# Patient Record
Sex: Male | Born: 1935 | Race: White | Hispanic: No | Marital: Married | Smoking: Former smoker
Health system: Southern US, Community
[De-identification: ages and names within clinical notes are randomized; demographics above are authoritative.]

## PROBLEM LIST (undated history)

## (undated) DIAGNOSIS — F329 Major depressive disorder, single episode, unspecified: Secondary | ICD-10-CM

## (undated) DIAGNOSIS — M1990 Unspecified osteoarthritis, unspecified site: Secondary | ICD-10-CM

## (undated) DIAGNOSIS — E785 Hyperlipidemia, unspecified: Secondary | ICD-10-CM

## (undated) DIAGNOSIS — E039 Hypothyroidism, unspecified: Secondary | ICD-10-CM

## (undated) DIAGNOSIS — I639 Cerebral infarction, unspecified: Secondary | ICD-10-CM

## (undated) DIAGNOSIS — Z8673 Personal history of transient ischemic attack (TIA), and cerebral infarction without residual deficits: Secondary | ICD-10-CM

## (undated) DIAGNOSIS — I2510 Atherosclerotic heart disease of native coronary artery without angina pectoris: Secondary | ICD-10-CM

## (undated) DIAGNOSIS — I10 Essential (primary) hypertension: Secondary | ICD-10-CM

## (undated) DIAGNOSIS — I219 Acute myocardial infarction, unspecified: Secondary | ICD-10-CM

## (undated) DIAGNOSIS — F32A Depression, unspecified: Secondary | ICD-10-CM

## (undated) DIAGNOSIS — K219 Gastro-esophageal reflux disease without esophagitis: Secondary | ICD-10-CM

## (undated) DIAGNOSIS — N184 Chronic kidney disease, stage 4 (severe): Secondary | ICD-10-CM

## (undated) DIAGNOSIS — I119 Hypertensive heart disease without heart failure: Secondary | ICD-10-CM

## (undated) DIAGNOSIS — Z9289 Personal history of other medical treatment: Secondary | ICD-10-CM

## (undated) DIAGNOSIS — R0602 Shortness of breath: Secondary | ICD-10-CM

## (undated) DIAGNOSIS — M519 Unspecified thoracic, thoracolumbar and lumbosacral intervertebral disc disorder: Secondary | ICD-10-CM

## (undated) HISTORY — PX: TRANSURETHRAL RESECTION OF PROSTATE: SHX73

## (undated) HISTORY — PX: CARDIAC CATHETERIZATION: SHX172

## (undated) HISTORY — PX: CORONARY STENT PLACEMENT: SHX1402

## (undated) HISTORY — PX: CORONARY ARTERY BYPASS GRAFT: SHX141

## (undated) HISTORY — PX: APPENDECTOMY: SHX54

## (undated) HISTORY — PX: TONSILLECTOMY: SUR1361

## (undated) HISTORY — PX: HERNIA REPAIR: SHX51

---

## 1999-01-05 ENCOUNTER — Inpatient Hospital Stay (HOSPITAL_COMMUNITY): Admission: EM | Admit: 1999-01-05 | Discharge: 1999-01-08 | Payer: Self-pay | Admitting: Emergency Medicine

## 1999-01-05 ENCOUNTER — Encounter: Payer: Self-pay | Admitting: Emergency Medicine

## 1999-01-20 ENCOUNTER — Encounter (HOSPITAL_COMMUNITY): Admission: RE | Admit: 1999-01-20 | Discharge: 1999-04-20 | Payer: Self-pay | Admitting: Cardiology

## 1999-06-09 ENCOUNTER — Encounter: Payer: Self-pay | Admitting: Cardiology

## 1999-06-09 ENCOUNTER — Inpatient Hospital Stay (HOSPITAL_COMMUNITY): Admission: EM | Admit: 1999-06-09 | Discharge: 1999-06-10 | Payer: Self-pay | Admitting: Emergency Medicine

## 2002-02-22 ENCOUNTER — Ambulatory Visit (HOSPITAL_COMMUNITY): Admission: RE | Admit: 2002-02-22 | Discharge: 2002-02-22 | Payer: Self-pay | Admitting: Cardiology

## 2002-02-27 ENCOUNTER — Ambulatory Visit (HOSPITAL_COMMUNITY): Admission: RE | Admit: 2002-02-27 | Discharge: 2002-02-28 | Payer: Self-pay | Admitting: Cardiology

## 2002-03-19 ENCOUNTER — Encounter (HOSPITAL_COMMUNITY): Admission: RE | Admit: 2002-03-19 | Discharge: 2002-06-17 | Payer: Self-pay | Admitting: Cardiology

## 2002-06-18 ENCOUNTER — Encounter: Admission: RE | Admit: 2002-06-18 | Discharge: 2002-09-16 | Payer: Self-pay | Admitting: Cardiology

## 2003-05-11 DIAGNOSIS — I219 Acute myocardial infarction, unspecified: Secondary | ICD-10-CM

## 2003-05-11 HISTORY — PX: CORONARY ARTERY BYPASS GRAFT: SHX141

## 2003-05-11 HISTORY — DX: Acute myocardial infarction, unspecified: I21.9

## 2004-01-29 ENCOUNTER — Encounter: Admission: RE | Admit: 2004-01-29 | Discharge: 2004-01-29 | Payer: Self-pay | Admitting: Neurological Surgery

## 2004-03-05 ENCOUNTER — Ambulatory Visit (HOSPITAL_COMMUNITY): Admission: RE | Admit: 2004-03-05 | Discharge: 2004-03-05 | Payer: Self-pay | Admitting: Cardiology

## 2004-03-12 ENCOUNTER — Inpatient Hospital Stay (HOSPITAL_COMMUNITY): Admission: EM | Admit: 2004-03-12 | Discharge: 2004-03-26 | Payer: Self-pay | Admitting: Emergency Medicine

## 2004-04-09 ENCOUNTER — Encounter: Admission: RE | Admit: 2004-04-09 | Discharge: 2004-04-09 | Payer: Self-pay | Admitting: Cardiothoracic Surgery

## 2004-04-20 ENCOUNTER — Encounter (HOSPITAL_COMMUNITY): Admission: RE | Admit: 2004-04-20 | Discharge: 2004-07-19 | Payer: Self-pay | Admitting: Cardiology

## 2004-05-07 ENCOUNTER — Encounter: Admission: RE | Admit: 2004-05-07 | Discharge: 2004-05-07 | Payer: Self-pay | Admitting: Cardiothoracic Surgery

## 2005-04-05 ENCOUNTER — Ambulatory Visit: Payer: Self-pay | Admitting: Physical Medicine & Rehabilitation

## 2005-04-05 ENCOUNTER — Inpatient Hospital Stay (HOSPITAL_COMMUNITY): Admission: RE | Admit: 2005-04-05 | Discharge: 2005-04-10 | Payer: Self-pay | Admitting: Orthopedic Surgery

## 2005-04-09 HISTORY — PX: JOINT REPLACEMENT: SHX530

## 2005-04-18 IMAGING — CR DG CHEST 2V
2 series · 2 of 2 positions shown · non-contrast
Comparison: none

CLINICAL DATA: Three weeks post CABG/CAD.  Back pain. 
 CHEST, TWO VIEWS: 
 Since 03/25/04 development of approximately 3.5cm long x 2.5cm wide opacity is seen at the periphery of the left mid lung field on PA view.  This could represent pleural opacity or area of slight focal atelectatic or inflammation.  Interval clearing is seen at the left lung base with marked decrease in small right pleural effusion.  The lungs are otherwise clear.  Heart size remains normal with post CABG change.  Removal of right internal jugular central venous catheter is noted with stable right hilar benign calcified lymph nodes.

[view not recorded (1 of 2)]
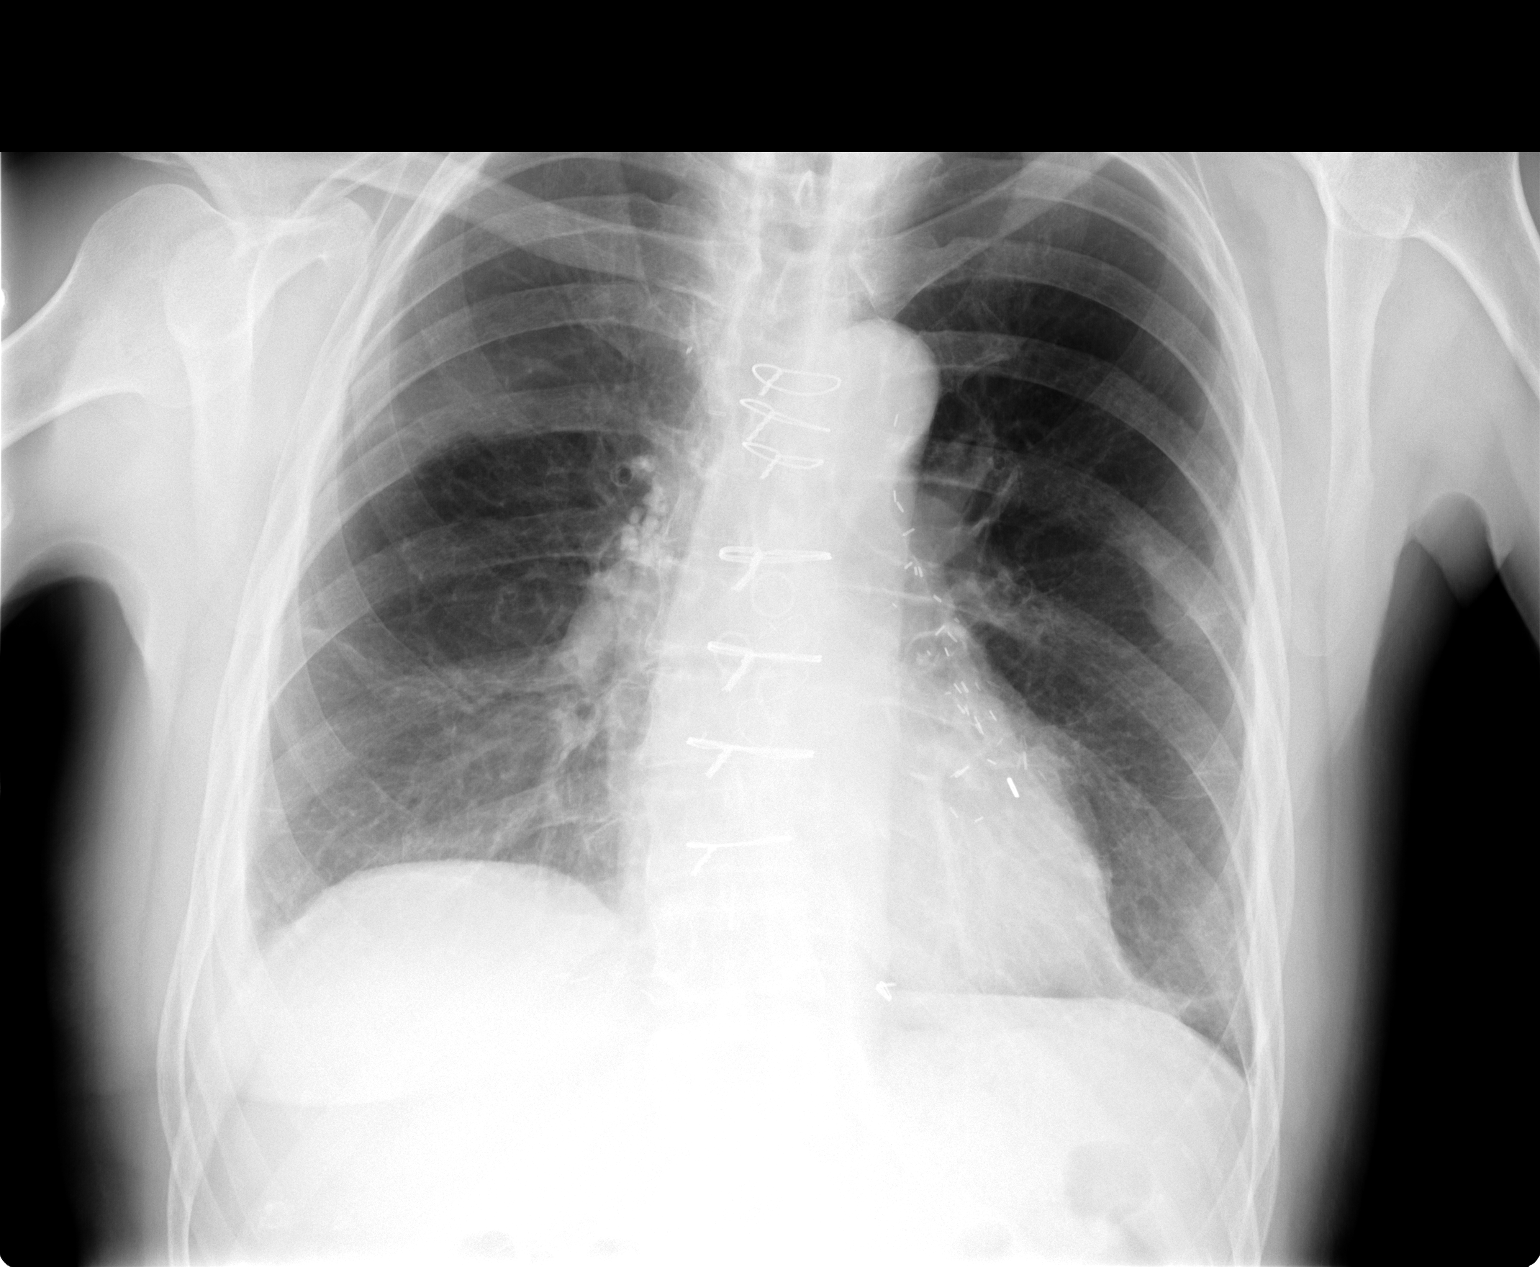

[view not recorded (2 of 2)]
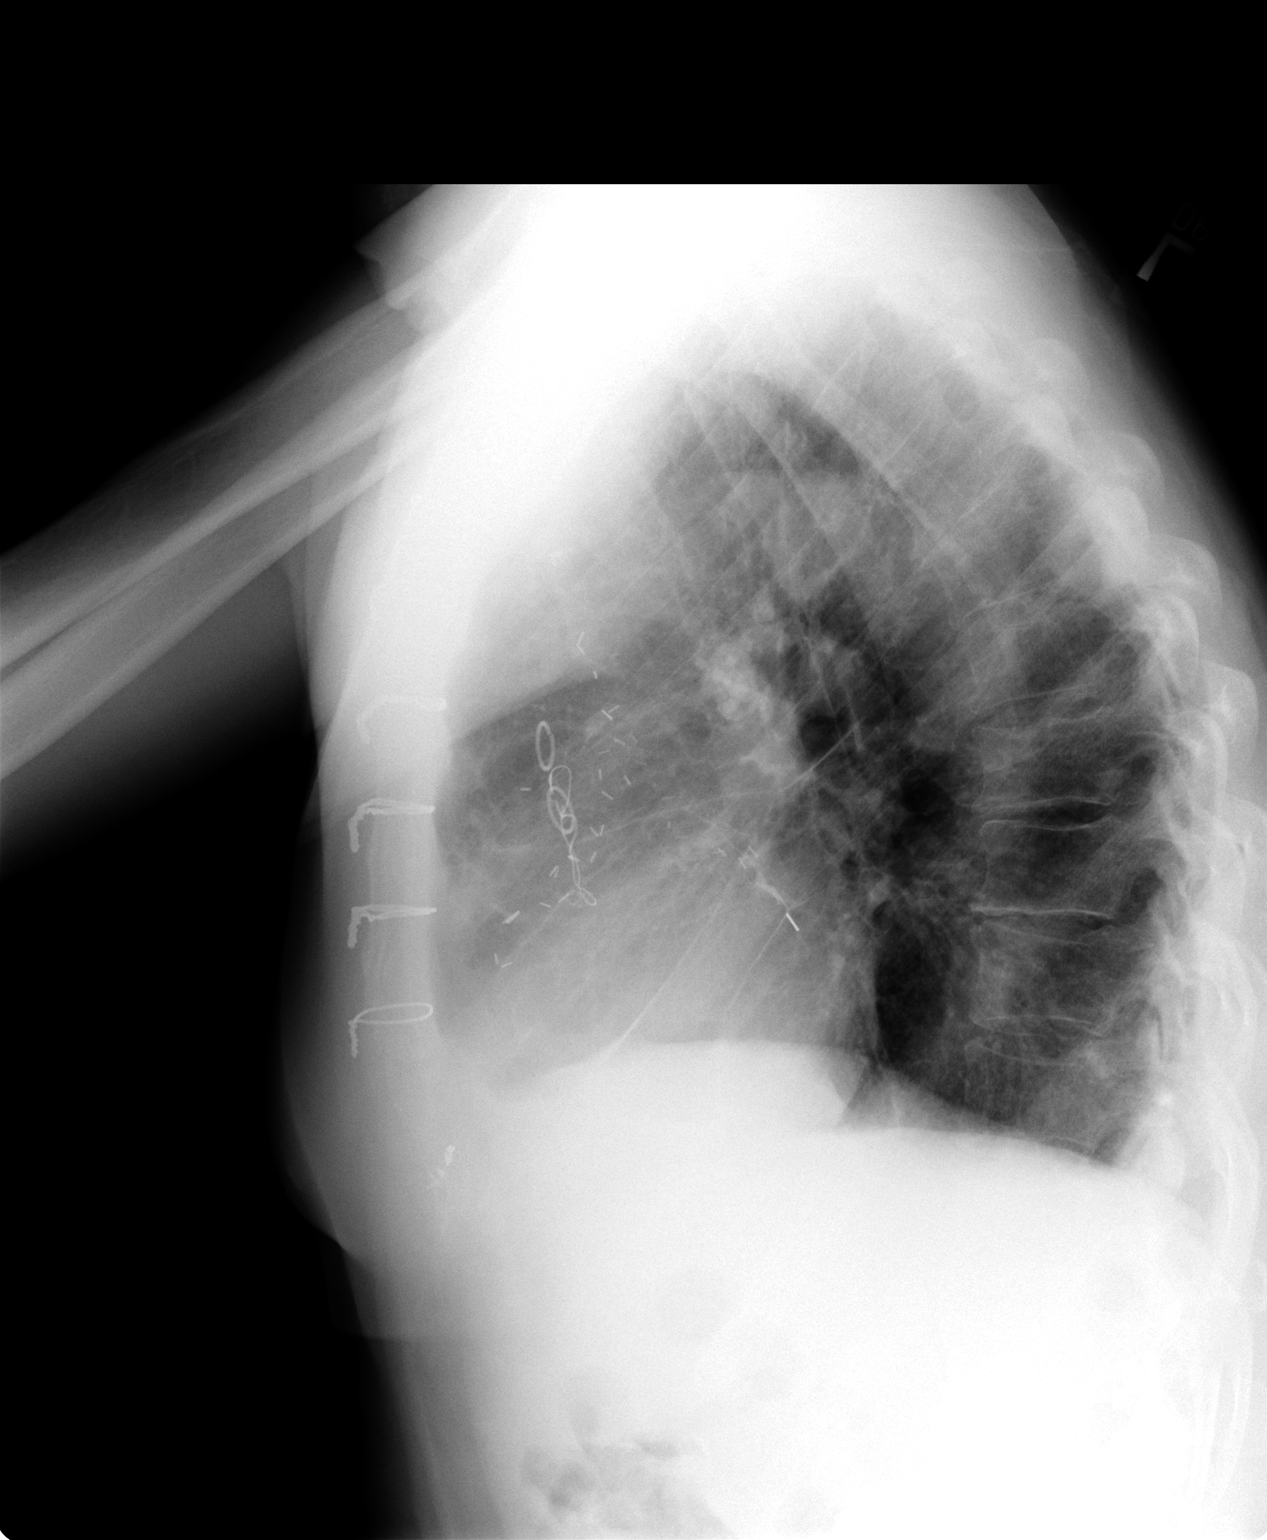

[2 of 2 positions shown; findings below may reference images not displayed]

IMPRESSION: Since 03/24/04: 
 [DATE].  Development of focal opacity at the periphery left mid lung as described with regression of previous ovoid opacity left lung base and decrease in small right pleural effusion. 
 2.  Otherwise no active disease.

## 2005-05-16 IMAGING — CR DG CHEST 2V
2 series · 2 of 2 positions shown · non-contrast
Comparison: none

CLINICAL DATA: CAD.  Post CABG.  Follow-up of left mid lung opacity on chest x-ray of 04/09/04.
 DIAGNOSTIC CHEST ? TWO VIEWS:

[view not recorded (1 of 2)]
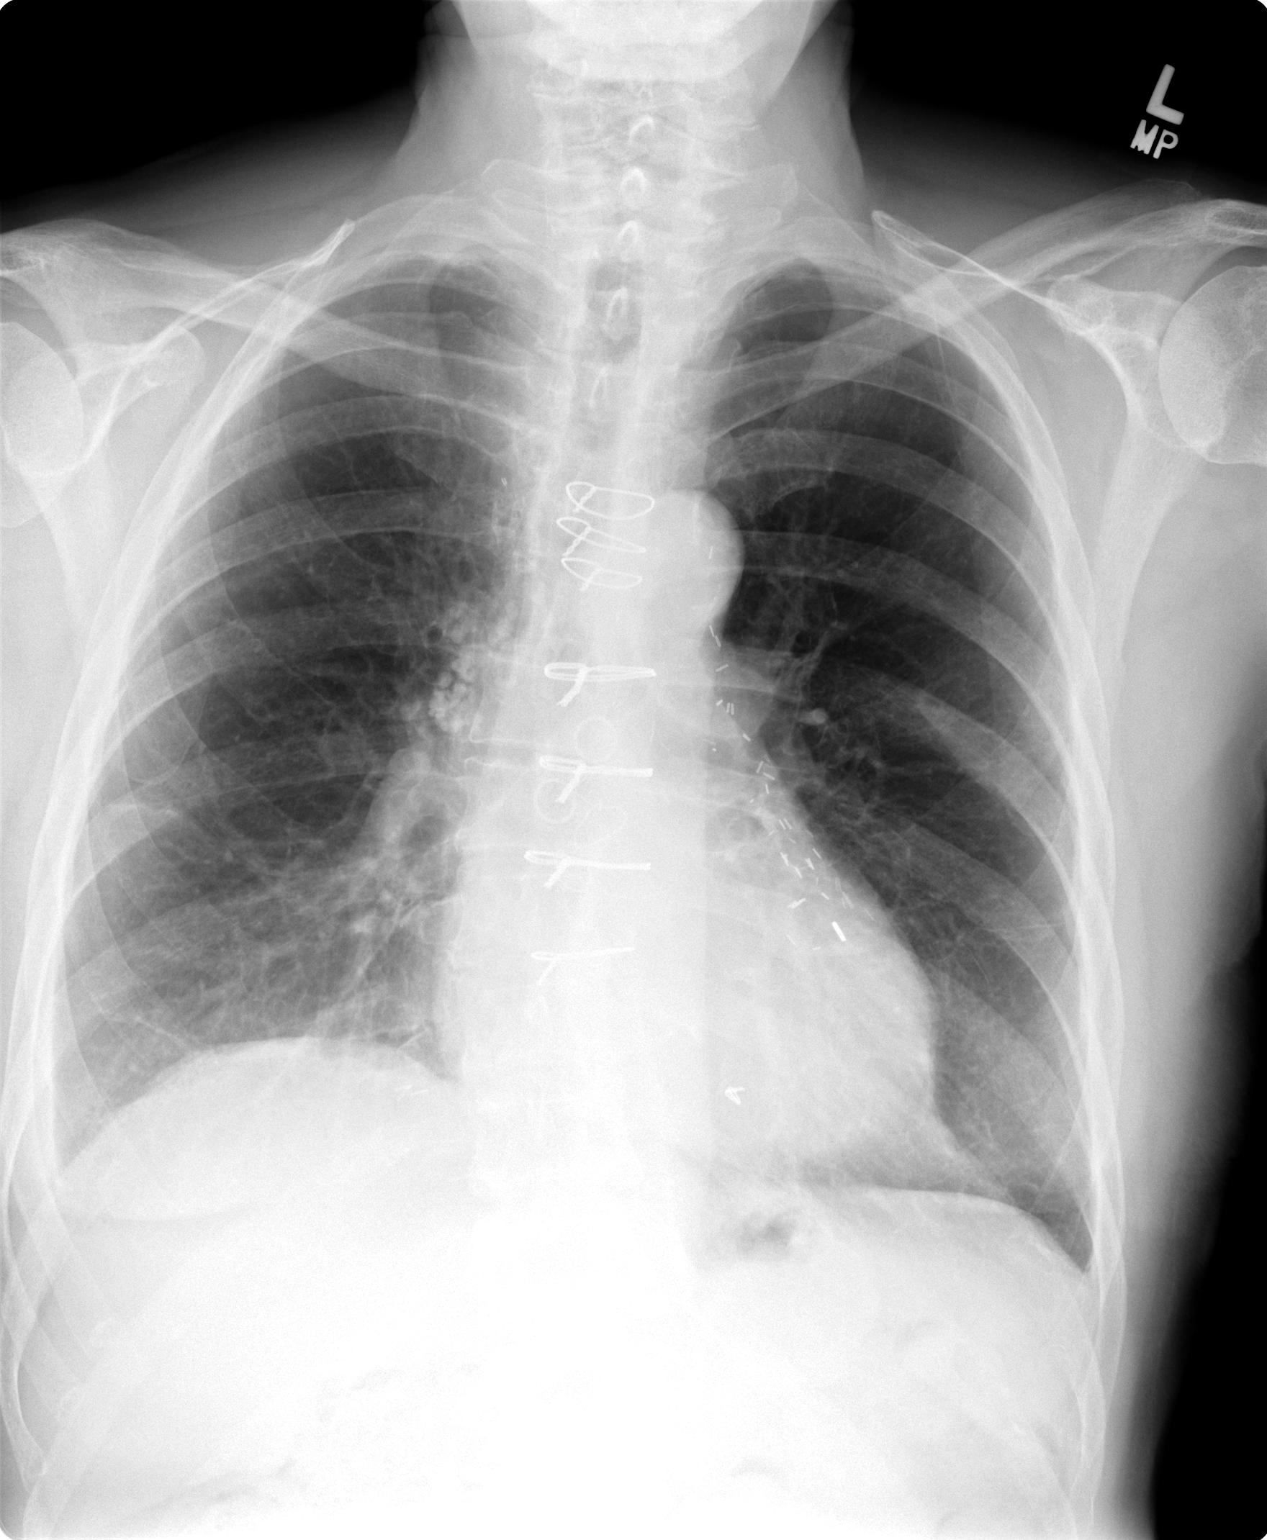

[view not recorded (2 of 2)]
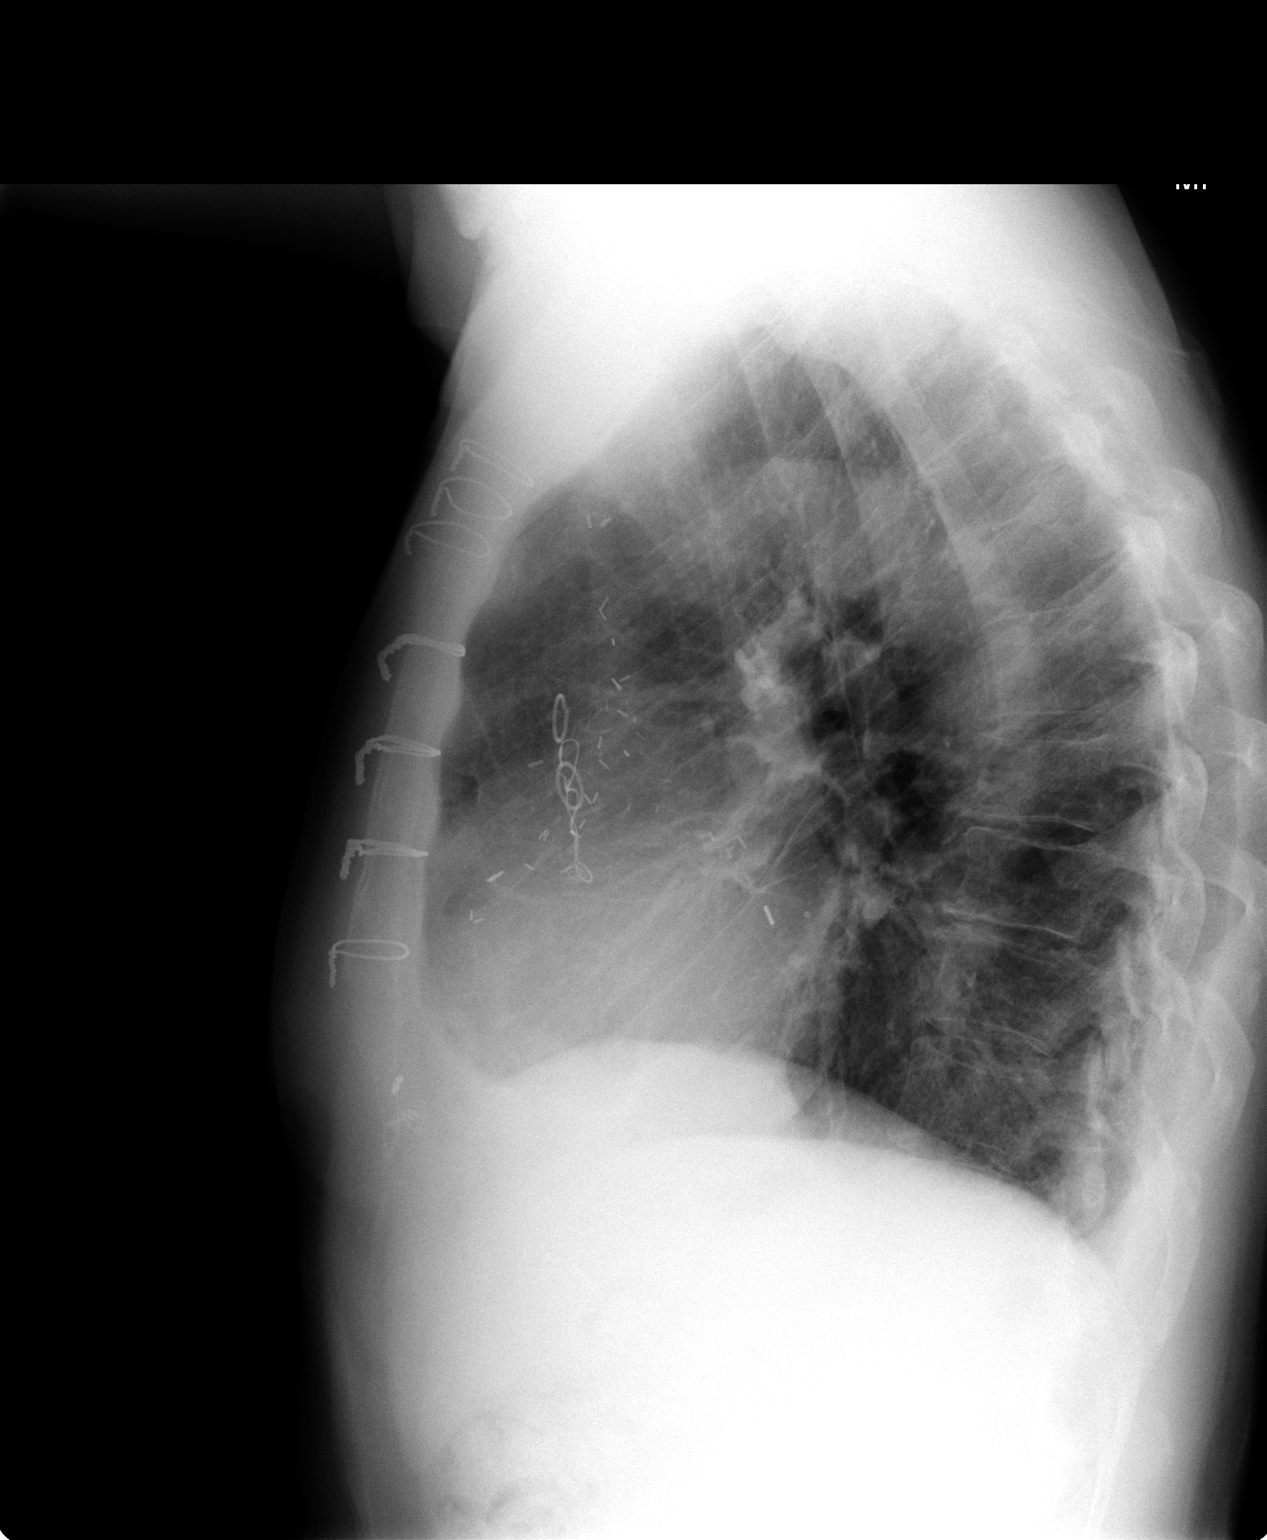

[2 of 2 positions shown; findings below may reference images not displayed]

FINDINGS: Comparison is made with GI-[REDACTED] chest x-ray, 04/09/04.  Since the prior study, there is resolution of previous 3.5 cm long x 2.5 cm wide opacity at the periphery of the left mid lung field.  Stable slight chronic interstitial lung changes are seen at the right lower lobe and lesser lower lobe with the lungs otherwise clear.  Heart size remains normal with post CABG changes.  Stable benign appearing right hilar calcified lymph nodes are noted.   The mediastinum, hila, pleura and osseous structures are stable.
IMPRESSION: Since [REDACTED] chest x-ray 04/09/04:
 1.  Interval clearing of left mid lung focal opacity.
 2.  Stable old granulomatous disease and right greater than left lower lobe slight chronic interstitial lung changes. 
 3.  Otherwise no active disease.

## 2006-03-21 ENCOUNTER — Encounter: Admission: RE | Admit: 2006-03-21 | Discharge: 2006-03-21 | Payer: Self-pay | Admitting: Orthopedic Surgery

## 2006-06-03 ENCOUNTER — Encounter: Admission: RE | Admit: 2006-06-03 | Discharge: 2006-06-03 | Payer: Self-pay | Admitting: Orthopaedic Surgery

## 2006-06-21 ENCOUNTER — Encounter: Admission: RE | Admit: 2006-06-21 | Discharge: 2006-06-21 | Payer: Self-pay | Admitting: Orthopaedic Surgery

## 2007-01-23 ENCOUNTER — Encounter: Admission: RE | Admit: 2007-01-23 | Discharge: 2007-01-23 | Payer: Self-pay | Admitting: Cardiology

## 2008-08-08 HISTORY — PX: KIDNEY CYST REMOVAL: SHX684

## 2008-08-21 ENCOUNTER — Encounter: Admission: RE | Admit: 2008-08-21 | Discharge: 2008-08-21 | Payer: Self-pay | Admitting: Urology

## 2009-02-22 ENCOUNTER — Observation Stay (HOSPITAL_COMMUNITY): Admission: EM | Admit: 2009-02-22 | Discharge: 2009-02-23 | Payer: Self-pay | Admitting: Emergency Medicine

## 2010-05-10 HISTORY — PX: OTHER SURGICAL HISTORY: SHX169

## 2010-08-13 LAB — COMPREHENSIVE METABOLIC PANEL
ALT: 13 U/L (ref 0–53)
AST: 22 U/L (ref 0–37)
Albumin: 4 g/dL (ref 3.5–5.2)
Alkaline Phosphatase: 80 U/L (ref 39–117)
BUN: 42 mg/dL — ABNORMAL HIGH (ref 6–23)
CO2: 23 mEq/L (ref 19–32)
Calcium: 9.3 mg/dL (ref 8.4–10.5)
Chloride: 109 mEq/L (ref 96–112)
Creatinine, Ser: 2.53 mg/dL — ABNORMAL HIGH (ref 0.4–1.5)
GFR calc Af Amer: 29 mL/min — ABNORMAL LOW (ref >60)
GFR calc non Af Amer: 24 mL/min — ABNORMAL LOW (ref >60)
Glucose, Bld: 122 mg/dL — ABNORMAL HIGH (ref 70–99)
Potassium: 3.9 mEq/L (ref 3.5–5.1)
Sodium: 141 mEq/L (ref 135–145)
Total Bilirubin: 1.1 mg/dL (ref 0.3–1.2)
Total Protein: 8 g/dL (ref 6.0–8.3)

## 2010-08-13 LAB — MAGNESIUM: Magnesium: 2.2 mg/dL (ref 1.5–2.5)

## 2010-08-13 LAB — DIFFERENTIAL
Basophils Absolute: 0 10*3/uL (ref 0.0–0.1)
Basophils Relative: 0 % (ref 0–1)
Eosinophils Absolute: 0 10*3/uL (ref 0.0–0.7)
Eosinophils Relative: 0 % (ref 0–5)
Lymphocytes Relative: 5 % — ABNORMAL LOW (ref 12–46)
Lymphs Abs: 0.8 10*3/uL (ref 0.7–4.0)
Monocytes Absolute: 0.6 10*3/uL (ref 0.1–1.0)
Monocytes Relative: 4 % (ref 3–12)
Neutro Abs: 14.5 10*3/uL — ABNORMAL HIGH (ref 1.7–7.7)
Neutrophils Relative %: 91 % — ABNORMAL HIGH (ref 43–77)

## 2010-08-13 LAB — URINE MICROSCOPIC-ADD ON

## 2010-08-13 LAB — CBC
HCT: 39.3 % (ref 39.0–52.0)
Hemoglobin: 13.1 g/dL (ref 13.0–17.0)
MCHC: 33.4 g/dL (ref 30.0–36.0)
MCV: 96.4 fL (ref 78.0–100.0)
Platelets: 287 10*3/uL (ref 150–400)
RBC: 4.08 MIL/uL — ABNORMAL LOW (ref 4.22–5.81)
RDW: 13.5 % (ref 11.5–15.5)
WBC: 16 10*3/uL — ABNORMAL HIGH (ref 4.0–10.5)

## 2010-08-13 LAB — URINALYSIS, ROUTINE W REFLEX MICROSCOPIC
Bilirubin Urine: NEGATIVE
Glucose, UA: NEGATIVE mg/dL
Ketones, ur: NEGATIVE mg/dL
Nitrite: NEGATIVE
Protein, ur: 30 mg/dL — AB
Specific Gravity, Urine: 1.022 (ref 1.005–1.030)
Urobilinogen, UA: 0.2 mg/dL (ref 0.0–1.0)
pH: 5 (ref 5.0–8.0)

## 2010-08-13 LAB — LIPASE, BLOOD: Lipase: 26 U/L (ref 11–59)

## 2010-08-13 LAB — TSH: TSH: 5.804 u[IU]/mL — ABNORMAL HIGH (ref 0.350–4.500)

## 2010-08-13 LAB — BASIC METABOLIC PANEL
BUN: 32 mg/dL — ABNORMAL HIGH (ref 6–23)
Calcium: 8 mg/dL — ABNORMAL LOW (ref 8.4–10.5)
GFR calc non Af Amer: 28 mL/min — ABNORMAL LOW (ref >60)
Glucose, Bld: 121 mg/dL — ABNORMAL HIGH (ref 70–99)
Sodium: 139 mEq/L (ref 135–145)

## 2010-08-13 LAB — PHOSPHORUS: Phosphorus: 2.6 mg/dL (ref 2.3–4.6)
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
## 2010-09-25 NOTE — Cardiovascular Report (Signed)
NAME:  [name], [name] NO.:  [id number]   MEDICAL RECORD NO.:  [id number]                   PATIENT TYPE:  OIB   LOCATION:  2854                                 FACILITY:  [facility]   PHYSICIAN:  W. Ashley Royalty., M.D.         DATE OF BIRTH:  1920-05-24   DATE OF PROCEDURE:  02/22/2002  DATE OF DISCHARGE:                              CARDIAC CATHETERIZATION   HISTORY:  A 77 year old male who has a previous history of coronary bypass  grafting in 1993 who presented with increasing chest discomfort that was  partially relieved with Plavix as well as Norvasc.  New inferior T wave  inversions.   COMMENTS ABOUT PROCEDURE:  The patient tolerated the procedure well without  complications.  The internal mammary graft was selected with a mammary  catheter.  The remaining vessels were selected with a right coronary  catheter.  He tolerated the procedure well.   HEMODYNAMIC DATA:  1. Aorta post contrast 120/70.  2. LV post contrast 120/0 to 10-15.   ANGIOGRAPHIC DATA:  1. Left ventriculogram:  Performed in the 30-degree RAO projection.  The     aortic valve appears normal.  There is mild mitral regurgitation noted     which may in part be catheter- or PVC-induced.  There is inferior wall     hypokinesis noted which is moderate to severe.  The remaining segments     contract normally.  The estimated ejection fraction is 50-55%.  2. Coronary arteries arise and distribute normally.  Heavily calcified left     main and proximal LAD.  Mild calcification in proximal right coronary     artery.  3. The left main coronary artery is calcified with mild distal narrowing.  4. Left anterior descending artery:  A 70% stenosis prior to septal     perforator and the native LAD.  The first diagonal branch is small with     an ostial stenosis.  The second diagonal branch is widely patent.  The     distal vessel is occluded and fills by a patent graft.  5. Circumflex  coronary artery:  Calcified and is diffusely diseased in its     proximal portion and mid portion.  Both obtuse marginal arteries are     occluded and fill by collaterals from the left coronary system.     Collateral filling is seen going to the distal right coronary artery.  6. Right coronary artery:  Calcified proximally.  Severe 95% proximal     stenosis which is segmental.  Diffusely diseased prior to a continuation     branch and is then totally occluded.  7. Saphenous vein graft to OM-1 and OM-1:  There is a mild 30-40%     anastomotic narrowing.  There is a mid vessel valve but the graft is     widely patent with two patent distal anastomotic sites.  8. Saphenous vein  graft to the second diagonal:  Occluded.  9. Saphenous vein graft to the posterior descending and posterolateral     branch:  Proximal anastomotic site is mildly narrowed.  There has been a     severe eccentric proximal stenosis estimated at 99% in the shaft of the     vein graft proximally.  The two distal anastomotic sites are widely     patent.  There is stenosis in the posterior descending artery after the     insertion site of the vein graft to the posterior descending portion     estimated at 90% in the distal vessel.  10.      Internal mammary artery graft to the LAD and diagonal:  Widely     patent.  The distal LAD and diagonal are very small vessels but appear to     be small and somewhat atretic.   IMPRESSION:  1. Normal left ventricular function with inferior wall hypokinesis but     preserved ejection fraction.  2. Severe native three-vessel coronary artery disease.  3. Saphenous vein graft disease with severe vein graft stenosis and proximal     right coronary artery graft.  Occlusion of vein graft to diagonal.     Patent vein grafts to obtuse marginals #1 and #2.  Patent internal     mammary grafts to left anterior descending artery and diagonal.   RECOMMENDATIONS:  Hydrate and allow to recover from  contrast.  Consider  stenting of the vein graft to the posterior descending and posterolateral  branch.                                                Darden Palmer., M.D.    WST/MEDQ  D:  02/22/2002  T:  02/22/2002  Job:  [id number]   cc:   Gloriajean Dell. Andrey Campanile, M.D.   Cardiac Catheterization Lab

## 2010-09-25 NOTE — Cardiovascular Report (Signed)
NAMEABDULLAHI, [name]                ACCOUNT NO.:  [id number]   MEDICAL RECORD NO.:  [id number]          PATIENT TYPE:  AMB   LOCATION:  ENDO                         FACILITY:  [facility]   PHYSICIAN:  W. Ashley Royalty., M.D.DATE OF BIRTH:  18-May-1920   DATE OF PROCEDURE:  03/05/2004  DATE OF DISCHARGE:                              CARDIAC CATHETERIZATION   PROCEDURE:  Cardiac catheterization.   INDICATIONS:  Chest pain, previous coronary bypass grafting, and abnormal  Cardiolite scan with inferior iscehmia and a new inferior infarction.   COMMENTS ABOUT PROCEDURE:  The patient tolerated the procedure well without  difficulty.  The internal mammary graft was selected using an IMA catheter.  It was difficult to selectively inject the IMA, but good visualization was  noted.  Following the procedure, he had good hemostasis and good peripheral  pulses noted.   HEMODYNAMICS:  Aorta post contrast -- 170/83.  LV post contrast -- 170/13-  20.   ANGIOGRAPHIC DATA:   LEFT VENTRICULOGRAM:  Performed in the 30-degree RAO projection.  The aortic  valve appears normal.  Mitral valve appears normal.  The left ventricle has  an area of inferior basal hypokinesis noted with slight reduction in  ejection fraction estimated at 45% to 50%.   Coronary arteries:  Arise and distribute normally.  Calcification noted in  the left main and proximal LAD, with a calcification noted in proximal right  coronary artery.   Left main coronary artery calcified with mild distal narrowing.   Left anterior descending:  Ninety percent proximal stenosis prior to the  septal perforator and the native LAD.  First diagonal branch is very small  and atretic.  There appears to be antegrade filling of the native left  anterior descending, which is a very small vessel, as well as filling by  means of the distal bypass graft.   Circumflex coronary artery:  Patent continuation branch, but occluded  marginal branch, as  collateral filling is seen extensively to the distal  right coronary artery.  There are several branches involved in the right  coronary artery which fill through collaterals.   Saphenous vein graft to the OM-1 and OM-2:  There is a 90% segmental  anastomotic narrowing.  There is a mid-vessel valve, but the graft is patent  with 2 patent distal anastomotic sites and moderate-sized distal vessels.  The saphenous vein graft to the second diagonal branch is occluded at  saphenous vein graft to the posterior descending and posterolateral branch.  The previous stented site is noted.  The graft appears to be occluded and is  not seen with selective injections, nor does it fill on the ventriculogram.  The internal mammary graft to the LAD and diagonal is widely patent.  The  distal LAD and diagonal are very small vessels, and are atretic, but appear  to be patent and filled by the graft as well as some antegrade flow.   IMPRESSION:  1.  Slightly reduced left ventricular function with inferior wall      hypokinesis.  2.  Severe native three-vessel coronary artery disease.  3.  Severe saphenous vein graft disease with severe vein graft stenosis      involving the obtuse marginal branch grafts, occlusion of the vein graft      to the right coronary artery, and occlusion of the vein graft to the      diagonal, patent internal mammary grafts to a small left anterior      descending and diagonal.   RECOMMENDATIONS:  Hydrate and allow to recover from contrast.  He has now  occluded the previously stented site of the vein graft to the right coronary  artery.  We will consult surgeons for possibility of redo coronary bypass  grafting.      Kristine Royal   WST/MEDQ  D:  03/05/2004  T:  03/05/2004  Job:  [id number]   cc:   Gloriajean Dell. Andrey Campanile, M.D.  [address]  [place]  [place] [address]  Fax: [phone]

## 2010-09-25 NOTE — H&P (Signed)
[name], NO NO.:  [id number]   MEDICAL RECORD NO.:  [id number]                    PATIENT TYPE:   LOCATION:                                       FACILITY:   PHYSICIAN:  W. Ashley Royalty., M.D.         DATE OF BIRTH:  [birth year]   DATE OF ADMISSION:  02/22/2002  DATE OF DISCHARGE:                                HISTORY & PHYSICAL   HISTORY OF PRESENT ILLNESS:  A 77 year old male who was brought in for  elective cardiac catheterization.  He has a prior history of coronary artery  bypass grafting August 24, 1991.  He had a small subendocardial infarction  and had catheterization on January 06, 1999.  At that time, he had  anteroapical and distal inferior hypokinesis with an EF of 40%-45%.  LAD had  an eccentric 80% stenosis prior to the septal perforator and diagonal  branch, distal vessel filled by collaterals.  Sequential vein graft to the  OM1 and 2 had moderate stenosis.  Saphenous vein graft to first diagonal  branch was diffusely diseased.  A vein graft to the PD and PL had a moderate  ostial 40% stenosis.  Mammary graft to the diagonal and LAD was patent.  The  distal LAD and diagonal grafts were somewhat atretic.  He has been treated  medically since that time.  He presented to the office after almost a one-  year absence with increasing malaise, fatigue, and chest discomfort.  He had  rarely taken nitroglycerin but noted significant pressure and tightness  which would radiate up into his neck.  These symptoms would be worse  occasionally with exertion and did begin a couple months ago and would occur  at night.  He has not had any congestive heart failure, claudication, or  TIA.  He stopped taking Pravachol but also noted that he continued to have  some difficulty with his memory and his concentration.  Catheterization was  advised because of new inferior T-wave changes.   PAST MEDICAL HISTORY:  Remarkable for hypertension, mild  renal  insufficiency.  No prior history of diabetes or ulcers.   PAST SURGICAL HISTORY:  Appendectomy, knee surgery, herniorrhaphy, and  coronary artery bypass grafting.   ALLERGIES:  1. TICLID.  2. CODEINE.  3. VOLTAREN.   CURRENT MEDICATIONS:  1. Aspirin daily.  2. Toprol XL 50 daily.  3. Xanax 0.25 daily.  4. He also has had previous Plavix 75 daily and Norvasc that were evaluated     the other day.   FAMILY HISTORY:  Father died in his 16s of kidney failure and heart failure.  Mother died at age 35 of CHF.  His sister is in good health.   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  He has been an Higher education careers adviser for several years.  His  wife is also an  Art gallery manager.  He has two children living.  A daughter died in  62 at age 55.  He does not smoke or use alcohol to excess.   REVIEW OF SYSTEMS:  He has an obsessive personality.  He has seen a  psychiatrist briefly in the past.  He has BPH previously.  He has been on an  experimental protocol through [facility] for memory loss.  He has had  significant hypertension.  He had a transient ischemic attack and has been  under treatment by the neurologists previously for this.  He had a possible  stroke versus TIA.  He has also had previous hernia surgery.  He has  seasonal perennial allergies and arthritis.  Other than as noted above, the  remainder of the review of systems is unremarkable.   PHYSICAL EXAMINATION:  GENERAL:  He is an elderly male who is a somewhat  rambling historian.  VITAL SIGNS:  Weight 173 pounds, blood pressure 114/70 sitting, 110/70  standing, pulse is 72.  SKIN:  Warm and dry.  HEENT:  No JVD, thyromegaly, or bruits.  Funduscopic unremarkable.  LUNGS:  Clear to A&P.  CARDIAC:  Normal S1 and S2.  No S3.  ABDOMEN:  Soft and nontender.  No aneurysm.  Pulses are 2+.  EXTREMITIES:  Peripheral pulses are diminished and are 1+.  There is no  edema.   NEUROLOGIC:  Normal.  GENITOURINARY/RECTAL:  Deferred.   IMPRESSION:  1. Chest discomfort with new T-wave changes noted inferiorly.  Rule out     progressive coronary graft disease.  2. Coronary artery bypass grafting for severe coronary disease in 1993 with     vein graft disease noted in 2000.  3. Hypertension.  4. Anxiety.   RECOMMENDATIONS:  Brought in at this time for cardiac catheterization.  The  procedure was discussed with the patient fully including the risks and he is  agreeable and willing to proceed.                                                Darden Palmer., M.D.    WST/MEDQ  D:  02/21/2002  T:  02/21/2002  Job:  [id number]   cc:   Gloriajean Dell. Andrey Campanile, M.D.

## 2010-09-25 NOTE — Consult Note (Signed)
[name], MARSCHALL NO.:  [id number]   MEDICAL RECORD NO.:  [id number]          PATIENT TYPE:  INP   LOCATION:  2920                         FACILITY:  [facility]   PHYSICIAN:  Sheliah Plane, MD    DATE OF BIRTH:  1921/03/15   DATE OF CONSULTATION:  03/13/2004  DATE OF DISCHARGE:                                   CONSULTATION   CARDIAC SURGERY CONSULTATION:   REFERRING PHYSICIAN:  Georga Hacking, M.D.   REASON FOR CONSULTATION:  Recurrent coronary occlusive disease   HISTORY OF PRESENT ILLNESS:  The patient is an 77 year old male, who has  known coronary occlusive disease, having had bypass surgery times seven in  1993.  About two years ago, a drug eluting stent was placed in the vein  graft to the right coronary artery.  Over the past several months, he had  progressive fatigue and substernal chest pain while playing golf.  Cardiolite stress test showed inferior wall ischemia and an ejection  fraction of 45%.  Cardiac catheterization was done last week which showed  total occlusion of the right vein graft, occlusion of the vein graft to a  first diagonal patent mammary to the second diagonal and LAD and a patent,  but diseased vein graft to 2 OM vessels with a proximal 80% stenosis.  The  patient has known renal insufficiency with a baseline creatinine of 1.6 to  1.7.  The patient was referred for evaluation of possible redo bypass and  came to my office on March 12, 2004.  When he arrived in the office, he  was diaphoretic and hypotensive with substernal chest pain.  He was given  chewable aspirin.  The IV was started and was immediately transferred to  [facility]. He was started on Integrilin, nitroglycerin and stabilized.  He had slight elevation of his MBs and since admission yesterday, has been  pain free.   PAST MEDICAL HISTORY:  Significant for hyperlipidemia, hypertension, history  of previous TIAs in 1994, thought to be basilar artery,  history of  osteoarthritis, anxiety, hypothyroidism, lumbar disk disease and mild renal  insufficiency, with a baseline creatinine of 1.6.   PAST SURGICAL HISTORY:  Includes coronary artery bypass grafting in 1993  times seven, hernia repair and TURP.   ALLERGIES:  Included CELEBREX, CODEINE, TICLID, LIPITOR, LODINE, AND  VOLTAREN.   SOCIAL HISTORY:  The patient is an Art gallery manager, lives with his wife.  He had  been a remote smoker, but none recently. Remains active, playing golf on a  very regular basis.   FAMILY HISTORY:  Is reviewed and unchanged.   REVIEW OF SYSTEMS:  Cardiac review of systems: The patient does note  substernal chest pain, relieved with rest.  He has had several episodes of  prolonged pain, sometimes related with diaphoresis.  He denies shortness of  breath. Denies orthopnea.  Denies syncope.  Denies pedal edema.   Other review of systems: The patient denies any constitutional symptoms.  He  has had no change in his weight.  He has had laser treatments to his left  eye with some decrease vision. Denies any definite amaurosis symptoms.  He  denies hemoptysis, cough or wheezing.  Has a history of reflux esophagitis  in the past.  Erectile dysfunction, TURP in the past, mild renal  insufficiency.  Occasionally nocturia.  Has chronic low back pain and  arthritis in both knees.  TIA symptoms several years ago involved a history  of numbness in both arms and he does have a history of anxiety disorder.   PHYSICAL EXAMINATION:  GENERAL:  The patient is resting in the coronary care  unit bed without chest pain or distress. Much improved from when he came to  the office.  VITAL SIGNS:  Blood pressure is 120/80 in the left arm, pulse of 80.  HEENT:  Pupils are equal, round and reactive to light.  NECK:  Without masses or jugular venous distention or carotid bruits.  LUNGS:  Clear.  CARDIAC:  Reveals healed median sternotomy incision.  I do not appreciate  any  murmur.   ABDOMEN:  Benign without palpable masses or aneurysm.  EXTREMITIES:  He has 2+ femoral pulses bilaterally, 1+ dorsalis pedis and  posterior tibial pulses bilaterally.  He has a healed saphenous vein harvest  site in the left leg below the knee and in the right leg to the mid-thigh.  NEUROLOGICAL:  Grossly intact.   IMPRESSION:  Acute coronary syndrome based on severe native and now  recurrent graft stenosis.  The patient is now stabilized on Integrilin and  heparin.  His Plavix was stopped several days ago.  With the ischemia by  Cardiolite inferiorly and total right and total vein graft to the right and  a severely proximally diseased vein graft to two OMs, redo coronary artery  bypass grafting has been recommended to the patient.  The increased risks of  surgery because of the critical nature of the disease and redo status and  age has been discussed with the patient in detail.  He is willing to  proceed. The risks of death, infection, stroke, myocardial infarction,  bleeding, and blood transfusions have all been discussed with the patient in  detail and he is willing to proceed. Will plan to proceed with redo  sternotomy on December 7.      Edwa   EG/MEDQ  D:  03/15/2004  T:  03/15/2004  Job:  [id number]

## 2010-09-25 NOTE — Discharge Summary (Signed)
[name], [name]                ACCOUNT NO.:  [id number]   MEDICAL RECORD NO.:  [id number]          PATIENT TYPE:  INP   LOCATION:  5005                         FACILITY:  [facility]   PHYSICIAN:  Loreta Ave, M.D. DATE OF BIRTH:  [birth year]   DATE OF ADMISSION:  04/05/2005  DATE OF DISCHARGE:  04/10/2005                                 DISCHARGE SUMMARY   FINAL DIAGNOSES:  1.  Status post left total knee replacement for end-stage degenerative joint      disease.  2.  Anemia NOS.  3.  Hypotension NOS.  4.  Cardiac dysrhythmias.  5.  Coronary artery disease.   HISTORY OF PRESENT ILLNESS:  An 77 year old white male with history of end-  stage DJD, left knee, and chronic pain, who presented to our office for  preoperative evaluation for a total knee replacement procedure.  He had  progressive worsening pain with failed response with conservative treatment.  Significant decrease in his daily activities due to the ongoing complaint.   HOSPITAL COURSE:  On April 05, 2005, the patient was taken to the [facility] operating room, and a left total knee replacement procedure was  performed.  Surgeon - Loreta Ave, M.D.  Assistant - Dimple Casey,  P.A.C.  Anesthesia - general.  Estimated blood loss was minimal.  There were  no specimens.  One Hemovac drain was placed.  There were no surgical or  anesthesia complications, and the patient was transferred to recovery in  stable condition.   On April 06, 2005, the patient was doing well with good pain control.  Vital signs were stable, afebrile.  Hemoglobin 9.1, sodium 137, potassium  5.3, chloride 106, CO2 of 24, BUN 43, creatinine 2.3, glucose 134, INR 1.2.  Dressing was clean, dry, and intact.  Calf nontender.  Neurovascularly  intact.  Changed IV to half normal saline at 85 cc per hour.  PT and OT  consults.  Started pharmacy protocol Coumadin.   On April 07, 2005, good pain control, left knee.  Complained of fatigue,  weakness.  Ambulated at 30 feet with therapy, and it was thought due to  becoming diaphoretic and orthostatic.  Denied chest pain and shortness of  breath.  Temperature 99, pulse 67, respirations 16, blood pressure 107/65.  Hemoglobin 8.0, hematocrit 23.0, platelets 199, WBC 10.9.  Sodium 136,  potassium 4.0, chloride 109, CO2 of 24, BUN of 42, creatinine 2.3, glucose  118, INR 2.3.  The wound looked good.  Staples were intact.  No signs of  infection.  No signs of infection.  Hemovac drain discontinued.  Calf  nontender.  Neurovascularly intact.  The patient transfused 2 units of  packed red blood cells.  Held HCTZ.  Discontinued morphine.  A  rehabilitation consult was ordered.  On April 07, 2005 at 10:50 p.m., the  patient had a hypertensive episode, again with diaphoresis.  A cardiology  consult was called, and he was seen by [name].   On April 08, 2005, the patient was feeling much better after a  transfusion.  No complaints of fatigue  or weakness.  Good pain control.  Temperature 98.3, pulse 64, respirations 16, blood pressure 143/73.  WBC of  12.6, hemoglobin 10.1, hematocrit 28.5, platelets 184.  Sodium 138,  potassium 4.1, chloride 107, CO2 of 25, BUN 33, creatinine 2.0, glucose 108,  INR of 3.9.  The wound looked good, staples intact.  No drainage or signs of  infection.  Calf nontender.  Neurovascularly intact.  Hep-Lock __________.  Discontinued Foley.  CBC with differential ordered.   On April 09, 2005, the patient was doing well, having good pain control.  Completed hall ambulation and stairs without difficulty.  No complaints of  chest pain or shortness of breath.  No fatigue or weakness.  Temperature of  97.1, pulse 60, respirations 16, blood pressure 144/75.  WBC 11.0.  Hemoglobin 10.1, hematocrit 28.7, platelets 212.  Sodium 137, potassium 4.1,  chloride 104, CO2 of 27, BUN 31, creatinine 2.0, glucose 108, INR of 4.5.  The wound looked good.  Staples intact.   No signs of infection.  No  drainage.  Neurovascularly intact distally.  Coumadin held.  Discontinued  Hep-Lock.   On April 10, 2005, the patient was doing well with no specific complaints.  No chest pain or shortness of breath.  The wound looked good.  Staples  intact.  Calf nontender.  Neurovascularly intact distally.  Hemoglobin 11.0,  hematocrit 31.6, WBC 12.1.  Sodium 136, potassium 3.9, glucose 112, INR 3.3.  The patient was doing very well and was ready for discharge home.   DISPOSITION:  Discharged home.   MEDICATIONS:  1.  Percocet 5/325, 1-2 tablets p.o. q.4-6h. p.r.n. pain.  2.  Pharmacy protocol for Coumadin x4 weeks postoperatively.  3.  Resume previous home medications.   CONDITION:  Good, stable.   INSTRUCTIONS:  The patient will work with home health, PT and OT to improve  ambulation, range of motion and strength.  Staples to be removed 2 weeks  postoperatively.  Daily dressing changes.  Will follow in the office in 2  weeks postoperatively for a recheck.  Return sooner if needed.      Loreta Ave, M.D.  Electronically Signed     DFM/MEDQ  D:  06/02/2005  T:  06/02/2005  Job:  [id number]
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
## 2010-09-25 NOTE — Cardiovascular Report (Signed)
   NAME:  [name], [name] NO.:  [id number]   MEDICAL RECORD NO.:  [id number]                   PATIENT TYPE:  OIB   LOCATION:  2874                                 FACILITY:  [facility]   PHYSICIAN:  W. Ashley Royalty., M.D.         DATE OF BIRTH:  05/26/1920   DATE OF PROCEDURE:  02/27/2002  DATE OF DISCHARGE:                              CARDIAC CATHETERIZATION   HISTORY OF PRESENT ILLNESS:  The patient is a 77 year old male with vein  graft stenosis and graft to right coronary artery, unstable angina.   DESCRIPTION OF PROCEDURE:  The patient was brought to the catheterization  lab and was prepped and draped in the usual manner.  After Xylocaine  anesthesia, a 7 French sheath was placed in the right femoral artery  percutaneously. A  7 [place] guiding catheter with side holes was used.  Angiomax was infused with bolus in constant infusion. A filter wire was  deployed in the distal vessel and the lesion was pre-dilated with a 2.5 mm  CrossSail balloon. Because of the patient's desire to have a drug-eluting  stent, a 2.5 x 13 mm Cypher drug-eluting stent was deployed in the vessel to  try to cover the lesion in the ostium. There was no other available size  than the 13 mm and the 18 was felt to be too long and to hang out into the  aneurysmal segment.  The lesion was post-dilated with an 8 mm Quantum  balloon to a maximum of 18 atmospheres and 3.4 mm.  There was thought to be  still some narrowing at the ostium and the balloon was placed in the second  common and dilated again to 18 atmospheres with a good result. The filter  wire was then captured and removed and post-dilatation angiograms were  obtained showing an excellent angiographic result.   ANGIOGRAPHIC DATA:  Saphenous vein graft to the distal posterior descending  and posterolateral branch pre-dilatation showed a severe stenosis in the  proximal portion estimated at 99% near the ostium.   Post-dilatation  angiograms show 0% residual stenosis.  There is preservation of all side  branches and no evidence of slow flow. He also had good TIMI-3 blood flow.  The filter wire was examined and showed contents with some embolic material.                                                   W. Ashley Royalty., M.D.    WST/MEDQ  D:  02/27/2002  T:  02/27/2002  Job:  [id number]   cc:   Arturo Morton. Riley Kill, M.D. [facility]   Cardiac Catheterization Laboratory   [facility]

## 2010-09-25 NOTE — Op Note (Signed)
Wayne, [name] NO.:  [id number]   MEDICAL RECORD NO.:  [id number]          PATIENT TYPE:  INP   LOCATION:  5005                         FACILITY:  [facility]   PHYSICIAN:  Loreta Ave, M.D. DATE OF BIRTH:  03/03/1921   DATE OF PROCEDURE:  04/05/2005  DATE OF DISCHARGE:                                 OPERATIVE REPORT   PREOPERATIVE DIAGNOSIS:  End-stage degenerative arthritis, left knee, with  varus alignment and flexion contracture.   POSTOPERATIVE DIAGNOSIS:  End-stage degenerative arthritis, left knee, with  varus alignment and flexion contracture.   OPERATIVE PROCEDURE:  Left total knee replacement utilizing [employer]  Osteon[employer] prosthesis.  Minimally-invasive system.  Cemented #9 posterior-  stabilized femoral component.  Cemented #9 tibial component with 12 mm  posterior-stabilized flex insert.  Cemented recessed 26 mm patellar  component.  Soft tissue balancing with medial capsular release.   SURGEON:  Loreta Ave, M.D.   ASSISTANT:  Genene Churn. Denton Meek.   ANESTHESIA:  General.   ESTIMATED BLOOD LOSS:  Minimal.   TOURNIQUET TIME:  1 hour 20 minutes.   SPECIMENS:  None.   CULTURES:  None.   COMPLICATIONS:  None.   DRESSING:  Soft compressive with knee immobilizer.   DRAINS:  Hemovac x1.   PROCEDURE:  Patient brought to the operating room and after adequate  anesthesia had been obtained, the left knee examined.  A little bit more  than 5 degrees of varus and a 5 degree flexion contracture, neither one  correctable.  Further flexion better than 100 degree.  Stable ligaments.  Tourniquet applied, prepped and draped in the usual sterile fashion.  Exsanguinated with elevation and Esmarch, tourniquet inflated to 300 mmHg.  A straight incision just above the patella down to the tibial tubercle.  Medial arthrotomy up to the superior medial margin of the patella and then a  vastus-splitting incision for a minimally-invasive system.  The  knee  exposed.  Grade 4 change throughout.  Medial capsular release.  Remnants of  menisci, cruciate ligaments, loose bodies, spurs removed.  Attention turned  to the tibia.  Extramedullary guide.  Perpendicular to the shaft.  Zero  degree guide.  Four to five millimeters dissected off the deficient medial  side, protecting posterior collateral structures.  Attention turned to the  femur.  The intramedullary guide placed.  Utilizing visual cues from the  tibia from the jigs as well as epicondylar axis, appropriate rotation  determined.  The anterior cut was made on the femur with appropriate guide  so that this would not be overcut or undercut.  Distal cut set at 5 degrees  of valgus resecting 10 mm.  Sized to a #9 component.  The jig was put in  place, definitive cuts made.  The patella is size, reamed and drilled for a  26 mm component.  Wound irrigated.  All recesses examined, all loose bodies  and spurs removed.  Remaining cuts made on the femur for the #9 component.  Trials put in place, a #9 on the femur, #9 on the tibia, and a 26 mm  on the  patella.  With the 12 mm insert on the tibia, I had nice, full extension,  good stability, good alignment, full flexion, full extension without lift-  off.  Also, good patellofemoral tracking.  The tibia was marked for  appropriate rotation and then hand-reamed.  All trials removed.  Copious  irrigation with a pulse irrigating device.  Cement prepared, placed on all  components, which were then firmly seated.  Once the excessive cement had  been removed, the cement was allowed to harden.  Once that was complete, the  knee was reexamined.  Nice, full extension, full flexion, good  patellofemoral tracking, good alignment, good stability in both flexion and  extension.  Wound irrigated.  A Hemovac placed, brought out through a  separate stab wound.  Arthrotomy closed with #1 Vicryl, skin and  subcutaneous tissue with Vicryl and staples.  Margins of  the wound and knee  injected with Marcaine.  A sterile compressive dressing applied.  Tourniquet  deflated and removed.  Knee immobilizer applied. Anesthesia reversed.  Brought to the recovery room.  Tolerated the surgery well with no  complications.      Loreta Ave, M.D.  Electronically Signed     DFM/MEDQ  D:  04/05/2005  T:  04/06/2005  Job:  [id number]

## 2010-09-25 NOTE — H&P (Signed)
NAMEREIS, GOGA NO.:  [id number]   MEDICAL RECORD NO.:  [id number]                    PATIENT TYPE:   LOCATION:                                       FACILITY:   PHYSICIAN:  W. Ashley Royalty., M.D.         DATE OF BIRTH:  1921-05-09   DATE OF ADMISSION:  02/27/2002  DATE OF DISCHARGE:                                HISTORY & PHYSICAL   REASON FOR ADMISSION:  Stenting.   HISTORY OF PRESENT ILLNESS:  This 77 year old male is seen for cardiac  stenting. The patient has a previous history of coronary artery bypass  grafting on August 23, 2001 and in August of 2000, he had a diseased graft of  the diagonal. He presented to the office recently with increasing chest  discomfort and was found to have new inferior T-wave inversions. Cardiac  catheterization last week revealed a tight stenosis involving the vein graft  of the right coronary artery. The vein graft of the diagonal was occluded.  Sequential vein graft to the OM was patent and the internal mammary graft to  the LAD and diagonal was patent. Because of renal insufficiency, he was  hydrated and sent home and when his renal function was deemed to be stable,  he was brought back for consideration of stenting of the vein graft to the  right coronary artery.   PAST MEDICAL HISTORY:  Borderline hypertension, renal insufficiency,  malaise, and fatigue. He has a prior history of transischemic attacks. He  denies other change in his past history as recorded in his previously  dictated record of last week.   CURRENT MEDICATIONS:  He has been on Plavix, aspirin, Toprol.   FAMILY HISTORY:  Unchanged from last week.   SOCIAL HISTORY:  Unchanged from last week.   REVIEW OF SYSTEMS:  Unchanged from last week.   PHYSICAL EXAMINATION:  GENERAL: A pleasant male who appears slightly younger  than his stated age.  VITAL SIGNS: Weight 172.5 pounds. Blood pressure 130/86 sitting and 132/84  standing.  Pulse 72.  SKIN: Warm and dry.  HEENT: No jugular venous distention, thyromegaly, mass, or bruit.  LUNGS: Clear to auscultation and percussion.  CARDIAC: Normal S1 and S2. No S3.  ABDOMEN: Soft and nontender.  EXTREMITIES: Femoral pulse 2+, peripheral pulses are 1+.   DIAGNOSTIC STUDIES:  12-lead ECG shows inferior T-wave inversions.   IMPRESSION:  1. Coronary artery disease with vein graft disease involving the right     coronary artery graft and occlusion of the diagonal graft.  2. Hypertension.  3. Mild renal insufficiency.  4. Coronary artery disease with previous vein graft disease.   RECOMMENDATIONS:  Brought in at this time for stenting of the right coronary  vein graft with filter wire protection. Dr. Bonnee Quin and Dr. Charlies Constable  will assist with the procedure. The procedure was discussed with the patient  fully including risks of  myocardial infarction, death, or cardiovascular  accident and he is agreeable to proceed. We also discussed possibly using  drug coded stents in the vein graft. He was very desirous of having this  done as opposed to bare metal stenting of the vein grafts and understands  issues involved with that.                                                Darden Palmer., M.D.    WST/MEDQ  D:  02/26/2002  T:  02/27/2002  Job:  [id number]   cc:   Gloriajean Dell. Andrey Campanile, M.D.

## 2010-09-25 NOTE — Discharge Summary (Signed)
[name], Wayne NO.:  [id number]   MEDICAL RECORD NO.:  [id number]          PATIENT TYPE:  INP   LOCATION:  2013                         FACILITY:  [facility]   PHYSICIAN:  Sheliah Plane, MD    DATE OF BIRTH:  October 13, 1920   DATE OF ADMISSION:  03/12/2004  DATE OF DISCHARGE:  03/26/2004                                 DISCHARGE SUMMARY   ADDENDUM:  [name] planned discharge for November 13 was delayed due to  elevation in his creatinine level.  He did have known mild renal  insufficiency prior to surgery with baseline creatinine approximately 1.7.  His creatinine has continued to rise since severe days after surgery and so  his discharge was held.  Renal consultation was requested on November 14,  and he was evaluated by Dr. Annie Sable.  Her impression was acute  on chronic renal failure secondary to acute myocardial infarction, cardiac  catheterization, and re-do coronary artery bypass grafting.  She felt that  he might see some improvement in his renal function if no further insults  occurred.  His Lasix was held because of his rising creatinine.  However,  she restarted some low-dose Lasix.  He continued to have good urine output  and over the next three days his creatinine did trend back down towards his  baseline.  His creatinine was high at 2.7 on the 15th, 2.5 on the 16th, and  this morning November 17 his creatinine was 2.3.  Although this is not at  his baseline yet, it is trending that way and it felt that he could be  monitored as an outpatient.  Lasix will not be continued at this time.  Followup blood work will be drawn by home health nurse on November 18 and  then again on November 22.  The results will be called to [facility] as  well as [name] and Dr. Dara Hoyer her primary care Kendrea Cerritos.  The  remainder of his discharge instructions remain the same as per the prior  discharge summary.      [place]   CTK/MEDQ  D:   03/26/2004  T:  03/27/2004  Job:  [id number]   cc:   [facility]   Teena Irani. Arlyce Dice, M.D.  [address]  [place]  [place] [address]  Fax: [phone]

## 2010-09-25 NOTE — Op Note (Signed)
NAMEYI, HAUGAN                ACCOUNT NO.:  [id number]   MEDICAL RECORD NO.:  [id number]          PATIENT TYPE:  INP   LOCATION:  2307                         FACILITY:  [facility]   PHYSICIAN:  Guadalupe Maple, M.D.  DATE OF BIRTH:  03-05-1921   DATE OF PROCEDURE:  03/16/2004  DATE OF DISCHARGE:                                 OPERATIVE REPORT   PROCEDURE:  Intraoperative transesophageal echocardiography.   [name] is an 77 year old white male with a history of previous  coronary artery bypass surgery who is now scheduled to undergo redo coronary  artery bypass surgery.  Intraoperative transesophageal echocardiography was  requested to evaluate left ventricular function and determine if any  valvular pathology was present.   The patient was brought to the operating room at [facility] and  general anesthesia was induced without difficulty.  The trachea was  intubated without difficulty.  The transesophageal echocardiography probe  was then inserted into the esophagus without difficulty.   IMPRESSION/PREBYPASS FINDINGS:  1.  Left ventricle.  There was akinesis of the basilar area of the inferior      wall but normal contractility of the anterior wall, lateral wall and      inferior apical area.  The left ventricular ejection fraction was      estimated at 45-50%.  There was no thrombus noted in the left      ventricular apex.  Left ventricular wall thickness measured 0.95 cm at      end-diastole at the mid papillary level.  2.  Mitral valve.  The mitral valve leaflets were mildly thickened.  There      was no prolapse or fluttering of the mitral valve and there was trace      mitral insufficiency.  3.  Aortic valve.  The aortic valve leaflets were mildly thickened with no      significant calcification in the leaflets.  The leaflets opened normally      and there was trace to 1+ aortic insufficiency.  4.  Right ventricle.  The right ventricular free wall appeared  to contract      normally and the right ventricular size appeared to be within normal      limits.  5.  Tricuspid valve.  Tricuspid valve appeared structurally intact with      trace tricuspid insufficiency.  6.  The interatrial septum was intact without evidence of patent foramen      ovale or atrial septal defect.  7.  The left atrium appeared to be within normal limits of size with no      thrombus noted in the left atrium or left atrial appendage.  8.  The proximal ascending aorta showed mild atheromatous disease but was      not aneurysmal or ectatic.  9.  The descending thoracic aorta showed mild atheromatous disease and      measured 2.58 cm in diameter.   POSTBYPASS FINDINGS:  1.  The left ventricular function appeared unchanged from the prebypass      study.  There was akinesis of the basilar  inferior wall area but other      areas of the heart appeared to contract well and the ejection fraction      was again estimated at 45-50%.  2.  The mitral valve appeared unchanged from the prebypass study.  There was      trace mitral insufficiency and the      mitral valve structurally appeared to be within normal limits.  3.  Aortic valve again showed trace to 1+ aortic insufficiency with      thickened aortic leaflets, but the aortic valve opened normally.       DCJ/MEDQ  D:  03/16/2004  T:  03/16/2004  Job:  [id number]   cc:   Patient's Chart

## 2010-09-25 NOTE — Op Note (Signed)
[name], [name] NO.:  [id number]   MEDICAL RECORD NO.:  [id number]          PATIENT TYPE:  INP   LOCATION:  2307                         FACILITY:  [facility]   PHYSICIAN:  Sheliah Plane, MD    DATE OF BIRTH:  20-May-1920   DATE OF PROCEDURE:  03/16/2004  DATE OF DISCHARGE:                                 OPERATIVE REPORT   PREOPERATIVE DIAGNOSIS:  Recurrent coronary occlusive disease.   POSTOPERATIVE DIAGNOSIS:  Recurrent coronary occlusive disease.   SURGICAL PROCEDURE:  Redo coronary artery bypass grafting with the right  internal mammary as a graft to the first obtuse marginal and reverse  saphenous vein graft to the posterior descending coronary artery with  endovein harvesting from the left thigh.   SURGEON:  Sheliah Plane, M.D.   FIRST ASSISTANT:  Jerold Coombe, P.A.   BRIEF HISTORY:  The patient is an 77 year old male who underwent coronary  artery bypass grafting x7 in 1993.  He had done well over the years until  the past 2 years when he developed stenosis in sequential vein graft to the  PDPL.  A stent was placed in this but ultimately the graft occluded.  The  patient recently returned with increasing anginal symptoms on Plavix and  underwent cardiac catheterization which demonstrated high grade stenosis in  the origin of the vein graft to the sequential OM's.  Previously, vein graft  to 2 small diagonals had occluded.  The sequential mammary to the diagonal  LAD had remained patent.  Redo coronary artery bypass grafting was being  contemplated.  The patient came to the office after being discharged from  the hospital with an episode of hypotension, diaphoresis, and was urgently  admitted from the office and was started on Integrilin, nitroglycerin, and  his condition stabilized and we recommended redo coronary artery bypass  grafting for him.  He agreed and signed informed consent.   DESCRIPTION OF PROCEDURE:  With Swan-Ganz and  arterial line monitor placed,  the patient underwent general endotracheal anesthesia without incident.  Skin of the chest and leg was prepped with Betadine and draped in the usual  sterile manner.  Using the [employer] endovein harvesting system, a vein was  harvested from the left thigh.  One segment of this vein was satisfactory  for use.  Median sternotomy was performed and the right internal mammary  artery was dissected down as a free graft to be used to the obtuse marginal  coronary artery.  The ascending aorta and the right atrium were dissected  out.  The patient was systemically heparinized.  The ascending aorta was  cannulated and the right atrium was cannulated.  Care was taken not to  disrupt the mammary artery.  The patient was placed on cardiopulmonary  bypass 2.4 L.  The remainder of the left ventricle and mammary arteries were  dissected free without damaging the mammary artery.  A vessel loop was  placed around the mammary artery to allow bulldog to be placed during  administration of cardioplegia.  Retrograde cardioplegic catheter was  placed.  The patient's body temperature  was cooled to 30 degrees.  A Walther  cross clamp was applied, 500 mL of cold blood test for cardioplegia was  administered antegrade.  Additional cold blood cardioplegia was  administered.  Retrograde bulldog was placed on the mammary artery.  Attention was turned first to the posterior descending coronary artery which  was opened and admitted a 1 mm probe distally.  The vein graft feeding to  the posterolateral branch was still patent.  The PL branch was extremely  small and was not thought to be a potential target for a re-bypass.  Using a  running 7-0 Prolene distal anastomosis to the posterior descending was  carried out with a segment of the first saphenous vein graft.  Attention was  then turned to the obtuse marginal coronary artery which was dissected out  of the intramyocardial position.  The  vessel was opened and admitted a 1 mm  probe distally, 1.5 mm probe proximally.  Using a running 8-0 Prolene, the  free right internal mammary artery was anastomosed to the first obtuse  marginal coronary artery.  Additional cold blood cardioplegia was  administered down the vein grafts and retrograde.  With the cross clamp  still in place, two punch aortotomies were performed on the ascending aorta  and the vein graft to the PD was anastomosed to the ascending aorta with a  running 7-0 Prolene.  The free graft mammary was anastomosed to the  ascending aorta with a running 7-0 Prolene.  Air was evacuated from the  aorta and from the grafts and the aorta cross clamp was removed.  Total  cross clamp time was 78 minutes.  The patient spontaneously converted to a  sinus rhythm.  The sites of anastomosis were inspected and free of bleeding.  The patient was then ventilated and weaned from cardiopulmonary bypass on  dopamine infusion.  He remained hemodynamically stable.  He was decannulated  in the usual fashion.  Protamine sulfate was administered with the operative  field hemostatic.  Two atrial and two ventricular pacing wires were applied.  Graft mark was applied.  A right pleural tube and two mediastinal tubes were  left in place.  The sternum was closed with #6 stainless steel wire.  The  fascia was closed with interrupted 0-Vicryl, running 3-0 Vicryl in the  subcutaneous tissues, 4-0 subcuticular stitch in the skin edges, and dry  dressings were applied.  Sponge and needle counts were reported as correct  at the completion of the procedure.  The patient tolerated the procedure  without obvious complication and was transferred to the surgical intensive  care unit for further postoperative care.      [place]   EG/MEDQ  D:  03/18/2004  T:  03/18/2004  Job:  [id number]   cc:   Darden Palmer., M.D.  [address]  [place]  [place] [address]  Fax: [phone]

## 2010-09-25 NOTE — Discharge Summary (Signed)
NAMEGIANPAOLO, [name] NO.:  [id number]   MEDICAL RECORD NO.:  [id number]          PATIENT TYPE:  INP   LOCATION:  2013                         FACILITY:  [facility]   PHYSICIAN:  Sheliah Plane, MD    DATE OF BIRTH:  [birth year]   DATE OF ADMISSION:  03/12/2004  DATE OF DISCHARGE:  03/22/2004                                 DISCHARGE SUMMARY   ANTICIPATED DATE OF DISCHARGE:  March 22, 2004.   ADMISSION DIAGNOSIS:  Recurrent coronary occlusive disease.   PAST MEDICAL HISTORY:  1.  Coronary artery disease, status post CABG in 1993 and status post PTCA      of the right coronary artery with a drug-eluting stent in 2004.  2.  Mild renal insufficiency.  3.  Hyperlipidemia.  4.  Hypertension.  5.  Status post TIA in 1994.  6.  Hypothyroidism.  7.  Osteoarthritis.  8.  Anxiety.  9.  Lumbar disk disease.   OTHER SURGICAL HISTORY:  1.  Hernia repair.  2.  TURP.   BRIEF HISTORY:  [name] is an 77 year old Caucasian man.  Over the past  several months prior to admission, he had progressive fatigue and substernal  chest pain, particularly while playing golf.  Cardiolite stress test  revealed inferior wall ischemia and an ejection fraction of 45%.  [name]  recommended cardiac catheterization was done in late October of 2005 and  showed a total occlusion of his right vein graft with occlusion of the vein  graft to the first diagonal and a patent mammary to the second diagonal and  patent diseased vein graft to two __________ vessels.  The patient was  referred to [name] for consideration of redo bypass surgery.  He was  scheduled in the office on March 12, 2004.  The patient awoke that morning  with some substernal chest tightness and diaphoresis.  This gradually  resolved.  He made his way to [name] office to keep his appointment.  By the time he arrived, he was diaphoretic and hypotensive with substernal  chest pain.  In the office, he was  treated with a chewable aspirin and EMS  was called for immediate transfer to [facility].  He was started on  Integrillin and nitroglycerin and his symptoms stabilized.  On arrival at  [facility], he was evaluated by his cardiologist, Georga Hacking, M.D.   HOSPITAL COURSE:  On March 12, 2004, [name] was admitted to [facility] and the care of [name], M.D.  He was continued on IV  heparin and Integrillin and serial cardiac enzymes were followed.  His  enzymes were negative for MI.  [name] completed a consultation while  in the hospital and agreed that redo coronary artery bypass grafting was the  preferred treatment choice for this patient.  He was scheduled for March 16, 2004.  [name] remained stable in the hospital the next two days  awaiting his surgery.  Preoperative arterial evaluation included a carotid  Doppler exam which revealed no significant internal carotid artery disease.  Upper extremity exam revealed abnormal Allen's test bilaterally.  Lower  extremity exam showed ABIs 0.94 on the right and 0.92 on the left.   On the morning of March 16, 2004, [name] underwent the following  surgical procedure with Sheliah Plane, M.D.:  Redo coronary artery bypass  grafting x 2.  A free right internal mammary artery was grafted to the first  obtuse marginal artery and saphenous vein was grafted to the posterior  descending artery.  Vein was harvested from the left thigh with an  endovascular technique.  He tolerated the procedure well, transferring in  stable condition to the SICU.  He remained hemodynamically stable in the  immediately postoperative period.  He was extubated in the early morning  hours of postoperative day #1.  He awoke from anesthesia neurologically  intact.  He required only routine care in the intensive care unit.  He did  stay an extra day because he was rather weak and not mobilizing very well.  He was ready for  transfer to unit 2000 on postoperative day #3.  [name]  creatinine was monitored closely during this admission.  His baseline is  around 1.6.  His creatinine has slowly crept up to 2.4 today, March 20, 2004.  He is continuing to make urine at an adequate volume.  His BUN is  stable.   [name] is making good progress in recovering from his surgery.  On  morning rounds this morning, March 20, 2004, postoperative day #4, his  vital signs were stable with a blood pressure of 136/75, his afebrile and  his room air saturation is 91%.  His heart is maintaining normal sinus  rhythm at 75 beats per minute.  His lungs have a few fine bibasilar  crackles.  He is tolerating his diet without nausea.  His urine output is  adequate.  His incisions are healing well.  He has no lower extremity edema.  He is ambulating in the hallway with cardiac rehabilitation assistance and a  rolling walker.  His pain is well controlled.  If [name] continues to  progress in this matter and his creatinine does not rise further, it is  anticipated he will be ready for discharge home in the next 48 hours.   LABORATORY STUDIES:  On March 20, 2004, CBC with white blood cells 13.9.  This is decreased from 2100 on postoperative day #2.  Hemoglobin 9.3,  hematocrit 27.3, platelets 182.  Chemistries included sodium 136, potassium  4.5, BUN 45 and creatinine 2.4.   CONDITION ON DISCHARGE:  Improved.   DISCHARGE MEDICATIONS:  1.  Enteric-coated aspirin 325 mg daily.  2.  Pravachol 40 mg daily.  3.  Levoxyl 50 mcg daily.  4.  Prevacid 20 mg daily.  5.  Methocarbamol 500 mg b.i.d.  6.  Vitamin C, calcium and glucosamine doses as before admission.  7.  Plavix 75 mg daily.  8.  For pain management, he may have Tylox one to two p.o. q.4h. for      moderate to severe pain or Tylenol 325 mg one to two p.o. q.4h. for mild      pain.  ACTIVITY:  He has been asked to refrain from any driving or heavy lifting,   pushing or pulling.  Also instructed to continue his breathing exercises and  daily walking.   DIET:  He is to continue with a low-fat, low-salt diet.   WOUND CARE:  He may shower with mild soap and  water.  If his incisions show  any signs of infection, he is to call [name] office.   FOLLOWUP:  He is to see [name] back in the office in approximately two  weeks.  He has been asked to call us to arrange that appointment and given  the office phone number.  [name] would like to see him back at the  [facility] office on Thursday, April 09, 2004, at 12 noon.  He will be asked to  have a chest x-ray at [facility] one hour prior to that visit and bring the chest x-  ray for [name] to review.      [place]   CTK/MEDQ  D:  03/20/2004  T:  03/21/2004  Job:  [id number]   cc:   Darden Palmer., M.D.  [address]  [place]  [place] [address]  Fax: [phone]   [facility]

## 2010-09-25 NOTE — Consult Note (Signed)
[name], [name]                ACCOUNT NO.:  [id number]   MEDICAL RECORD NO.:  [id number]          PATIENT TYPE:  INP   LOCATION:  2013                         FACILITY:  [facility]   PHYSICIAN:  Cecille Aver, M.D.DATE OF BIRTH:  08-28-1920   DATE OF CONSULTATION:  03/23/2004  DATE OF DISCHARGE:                                   CONSULTATION   REASON FOR CONSULTATION:  Persistent hyperkalemia with elevated creatinine.  This is an 77 year old Caucasian male with a history of hypertension,  coronary artery disease status post coronary artery bypass graft revision  during this hospitalization, history of chronic renal insufficiency who  presents with chest pain and discomfort.  Patient has undergone a coronary  artery bypass graft revision during this hospitalization and his creatinine  has gradually increased.  Patient says his baseline creatinine was 1.4 in  July of 2005.  Patient denies any symptoms of hematuria, dysuria, polyuria,  oliguria, or other urinary symptoms.  We are asked to evaluate the patient  for a creatinine of 2.4 and a potassium of 5.7.  Patient denies any history  of renal disease that he is aware of.  Denies any family history of renal  disease, although he has been hypertensive for quite some time.  His  hypertension is controlled with medications.   PAST MEDICAL HISTORY:  1.  Coronary artery disease status post CABG revision this hospitalization.  2.  Chronic renal insufficiency.  Baseline creatinine 1.11 November 2003.  3.  Hyperlipidemia.  4.  History of TIA in 1994.  5.  Osteoarthritis.  6.  Anxiety.  7.  Hypothyroidism.  8.  Lumbar disk disease.  9.  Benign prostatic hypertrophy with a history of TURP.   MEDICATIONS:  1.  Aspirin 325 mg daily.  2.  Protonix 40 mg daily.  3.  Synthroid 50 mcg daily.  4.  Zocor 20 mg each Monday, Wednesday, Friday.  5.  Robaxin 500 mg b.i.d.  6.  Colace 100 mg b.i.d.  7.  Dulcolax 10 mg daily.  8.  Avelox 400 mg  daily.  9.  Tylenol p.r.n.  10. Oxycodone p.r.n.  11. Ambien 5-10 mg q.h.s. as needed.  12. Benadryl 25 mg q.h.s. as needed.  13. Of note, patient was on Cozaar upon admission.  This has been      discontinued.  14. Patient was also on Lasix during this hospitalization which has also      been discontinued.   ALLERGIES:  CLONAZEPAM, CODEINE, VOLTAREN.   SOCIAL HISTORY:  There is no history of alcohol abuse.  Patient was a smoker  in the past, but has quit several years ago.  He is an avid golfer and is  quite active.   FAMILY HISTORY:  Noncontributory.   REVIEW OF SYSTEMS:  Per HPI.   PHYSICAL EXAMINATION:  GENERAL:  This is a Caucasian male who is laying  comfortably in bed.  He is in no acute distress.  VITAL SIGNS:  Pulse 86 and regular, respirations 16, blood pressure 126/72  mmHg, temperature 99.1, saturating 94% on room air.  HEENT:  Pupils are equal, round, and reactive to light.  Extraocular muscles  are intact.  Sclerae is anicteric.  Conjunctivae is moist.  NECK:  Supple.  There is no JVD, thyromegaly, tracheal deviations, or  carotid bruits.  CARDIOVASCULAR:  S1, S2 are regular in rate and rhythm.  There are no  murmurs, rubs, or gallops.  There is a healing sternotomy incision which is  clean, dry, and intact.  LUNGS:  Bilaterally few bibasilar crackles.  ABDOMEN:  Soft, nontender, nondistended.  There is positive bowel sounds in  all four quadrants.  There are no abdominal bruits.  EXTREMITIES:  Without edema.  NEUROLOGIC:  Patient is alert, awake, oriented to person, place, and time.  Cranial nerves II-XII are grossly intact.  There is no focal deficit.   LABORATORIES:  Urinalysis shows urine which is yellow in color.  Specific  gravity 1.017, pH 5.5.  There is large amounts of blood (most likely  traumatic).  No protein visible on UA.  Sodium 138, potassium 5.7, chloride  107, bicarbonate 23, BUN 36, creatinine 2.4, glucose 104 with WBC 13.5,  hemoglobin 10.2,  hematocrit 28.9, platelets 316, calculated creatinine  clearance using Cocroft's equation is 21.6.   ASSESSMENT/PLAN:  Acute renal failure.  Patient appears to have baseline  renal insufficiency with a creatinine of 1.4.  At his age his baseline  creatinine clearance would be estimated as 40-50.  Although his numerical  creatinine value was within normal limits, his creatinine clearance would  have been decreased and during this hospitalization the patient has had  multiple kidney insult including dye which may have been using during  procedures which would have worsened his kidney function.  This could also  be secondary to hypoperfusion of the kidneys, prerenal cause secondary to  volume depletion.  Also in the differential is type 4 renal tubular acidosis  (with hyperkalemia) or acute tubular necrosis or a urinary tract obstruction  secondary to benign prostatic hypertrophy.  Again, this patient has had some  kidney insult during his hospitalization to account for his increase in  creatinine.  Patient has also been on Lasix which may have been normalizing  his potassium.  With the discontinuation of Lasix his potassium began to  increase.  Also, the patient has been on no dietary restrictions in terms of  potassium and this may contribute to his hyperkalemia.  The patient's  creatinine has now been stable for four days and may benefit from a low dose  Lasix, 40 mg IV daily as well as potassium restriction.  To rule out the  possibility of an obstruction or hydronephrosis, will check a kidney  ultrasound.  At this point will not pursue a urine collection for protein,  creatinine, or electrolytes as the patient had no urine protein on his  urinalysis and at this point a secondary cause of his hyperkalemia such as  adrenal insufficiency, renal artery stenosis is less likely.  We will continue to monitor his creatinine and patient will be scheduled for follow-  up appointment [facility] with Dr. Kathrene Bongo.     IO/MEDQ  D:  03/23/2004  T:  03/23/2004  Job:  [id number]

## 2011-10-09 ENCOUNTER — Observation Stay (HOSPITAL_COMMUNITY)
Admission: EM | Admit: 2011-10-09 | Discharge: 2011-10-11 | Disposition: A | Discharge status: Home / Self Care | Payer: Medicare Other | Attending: Cardiology | Admitting: Cardiology

## 2011-10-09 ENCOUNTER — Encounter (HOSPITAL_COMMUNITY): Payer: Self-pay | Admitting: *Deleted

## 2011-10-09 ENCOUNTER — Emergency Department (HOSPITAL_COMMUNITY): Payer: Medicare Other

## 2011-10-09 DIAGNOSIS — I119 Hypertensive heart disease without heart failure: Secondary | ICD-10-CM

## 2011-10-09 DIAGNOSIS — I1310 Hypertensive heart and chronic kidney disease without heart failure, with stage 1 through stage 4 chronic kidney disease, or unspecified chronic kidney disease: Secondary | ICD-10-CM | POA: Insufficient documentation

## 2011-10-09 DIAGNOSIS — M519 Unspecified thoracic, thoracolumbar and lumbosacral intervertebral disc disorder: Secondary | ICD-10-CM

## 2011-10-09 DIAGNOSIS — E785 Hyperlipidemia, unspecified: Secondary | ICD-10-CM

## 2011-10-09 DIAGNOSIS — M1990 Unspecified osteoarthritis, unspecified site: Secondary | ICD-10-CM | POA: Insufficient documentation

## 2011-10-09 DIAGNOSIS — R55 Syncope and collapse: Principal | ICD-10-CM | POA: Insufficient documentation

## 2011-10-09 DIAGNOSIS — E039 Hypothyroidism, unspecified: Secondary | ICD-10-CM

## 2011-10-09 DIAGNOSIS — N184 Chronic kidney disease, stage 4 (severe): Secondary | ICD-10-CM | POA: Insufficient documentation

## 2011-10-09 DIAGNOSIS — Z8673 Personal history of transient ischemic attack (TIA), and cerebral infarction without residual deficits: Secondary | ICD-10-CM | POA: Insufficient documentation

## 2011-10-09 DIAGNOSIS — I2510 Atherosclerotic heart disease of native coronary artery without angina pectoris: Secondary | ICD-10-CM | POA: Insufficient documentation

## 2011-10-09 DIAGNOSIS — R42 Dizziness and giddiness: Secondary | ICD-10-CM

## 2011-10-09 HISTORY — DX: Chronic kidney disease, stage 4 (severe): N18.4

## 2011-10-09 HISTORY — DX: Unspecified thoracic, thoracolumbar and lumbosacral intervertebral disc disorder: M51.9

## 2011-10-09 HISTORY — DX: Cerebral infarction, unspecified: I63.9

## 2011-10-09 HISTORY — DX: Atherosclerotic heart disease of native coronary artery without angina pectoris: I25.10

## 2011-10-09 HISTORY — DX: Essential (primary) hypertension: I10

## 2011-10-09 HISTORY — DX: Acute myocardial infarction, unspecified: I21.9

## 2011-10-09 HISTORY — DX: Unspecified osteoarthritis, unspecified site: M19.90

## 2011-10-09 HISTORY — DX: Hyperlipidemia, unspecified: E78.5

## 2011-10-09 HISTORY — DX: Hypothyroidism, unspecified: E03.9

## 2011-10-09 HISTORY — DX: Personal history of transient ischemic attack (TIA), and cerebral infarction without residual deficits: Z86.73

## 2011-10-09 HISTORY — DX: Hypertensive heart disease without heart failure: I11.9

## 2011-10-09 LAB — POCT I-STAT, CHEM 8
BUN: 38 mg/dL — ABNORMAL HIGH (ref 6–23)
Creatinine, Ser: 2.5 mg/dL — ABNORMAL HIGH (ref 0.50–1.35)
Potassium: 4.2 mEq/L (ref 3.5–5.1)
Sodium: 144 mEq/L (ref 135–145)

## 2011-10-09 LAB — COMPREHENSIVE METABOLIC PANEL
Albumin: 3.4 g/dL — ABNORMAL LOW (ref 3.5–5.2)
BUN: 37 mg/dL — ABNORMAL HIGH (ref 6–23)
Creatinine, Ser: 2.55 mg/dL — ABNORMAL HIGH (ref 0.50–1.35)
Total Protein: 7.3 g/dL (ref 6.0–8.3)

## 2011-10-09 LAB — CBC
HCT: 38 % — ABNORMAL LOW (ref 39.0–52.0)
MCHC: 34.7 g/dL (ref 30.0–36.0)
MCV: 94.3 fL (ref 78.0–100.0)
RDW: 12.9 % (ref 11.5–15.5)

## 2011-10-09 LAB — POCT I-STAT TROPONIN I

## 2011-10-09 MED ORDER — SODIUM CHLORIDE 0.9 % IV BOLUS (SEPSIS)
1000.0000 mL | Freq: Once | INTRAVENOUS | Status: AC
  Administered 2011-10-09: 1000 mL via INTRAVENOUS

## 2011-10-09 MED ORDER — ASPIRIN EC 81 MG PO TBEC
162.0000 mg | DELAYED_RELEASE_TABLET | Freq: Once | ORAL | Status: DC
  Filled 2011-10-09: qty 2

## 2011-10-09 NOTE — ED Provider Notes (Signed)
History     CSN: [id number]  Arrival date & time 10/09/11  2302   First MD Initiated Contact with Patient 10/09/11 2309      Chief Complaint  Patient presents with  . Near Syncope    (Consider location/radiation/quality/duration/timing/severity/associated sxs/prior treatment) HPI History provided by patient. At home watching a hockey game, got up walked all across a room and then developed nausea with diaphoresis and dizziness. Patient has history of 10 heart bypass surgeries and multiple MIs.  His symptoms lasted about 30 minutes until EMS arrived. He taken a baby aspirin. He also takes one nitroglycerin which did not help the symptoms. He was concerned because he had similar symptoms with previous CABG. No chest pain or shortness of breath. No fever or cough. No leg pain or leg swelling. No recent illness. Symptoms have completely resolved and he feels much better. Brought in by EMS. No medications in route. Past Medical History  Diagnosis Date  . MI (myocardial infarction)   . Stroke   . Arrhythmia   . Hypertension   . High cholesterol     Past Surgical History  Procedure Date  . Bypass graft   . Coronary stent placement     History reviewed. No pertinent family history.  History  Substance Use Topics  . Smoking status: Not on file  . Smokeless tobacco: Not on file  . Alcohol Use:       Review of Systems  Constitutional: Positive for diaphoresis. Negative for fever.  HENT: Negative for neck pain and neck stiffness.   Eyes: Negative for pain.  Respiratory: Negative for shortness of breath.   Cardiovascular: Negative for palpitations and leg swelling.  Gastrointestinal: Positive for nausea. Negative for abdominal pain.  Genitourinary: Negative for dysuria.  Musculoskeletal: Negative for back pain.  Skin: Negative for rash.  Neurological: Positive for dizziness and light-headedness. Negative for syncope and headaches.  All other systems reviewed and are  negative.    Allergies  Codeine  Home Medications   Current Outpatient Rx  Name Route Sig Dispense Refill  . ACETAMINOPHEN 500 MG PO TABS Oral Take 1,000 mg by mouth every 6 (six) hours as needed. For pain    . VITAMIN C PO Oral Take 1 tablet by mouth daily.    . ASPIRIN EC 81 MG PO TBEC Oral Take 81 mg by mouth daily.    Marland Kitchen CALCIUM PO Oral Take 1 tablet by mouth daily.    . OMEGA-3 FATTY ACIDS 1000 MG PO CAPS Oral Take 1 g by mouth daily.    . IRON PO Oral Take 1 tablet by mouth daily.    Marland Kitchen LEVOTHYROXINE SODIUM 112 MCG PO TABS Oral Take 112 mcg by mouth daily.    . ADULT MULTIVITAMIN W/MINERALS CH Oral Take 1 tablet by mouth daily.    Marland Kitchen OMEPRAZOLE 20 MG PO CPDR Oral Take 20 mg by mouth daily.    Marland Kitchen PRAVASTATIN SODIUM 80 MG PO TABS Oral Take 80 mg by mouth daily.    . SERTRALINE HCL 100 MG PO TABS Oral Take 50 mg by mouth daily.    . TRIAMTERENE-HCTZ 75-50 MG PO TABS Oral Take 0.5 tablets by mouth daily.      There were no vitals taken for this visit.  Physical Exam  Constitutional: He is oriented to person, place, and time. He appears well-developed and well-nourished.  HENT:  Head: Normocephalic and atraumatic.  Eyes: Conjunctivae and EOM are normal. Pupils are equal, round, and reactive to  light.  Neck: Trachea normal. Neck supple. No thyromegaly present.  Cardiovascular: Normal rate, regular rhythm, S1 normal, S2 normal and normal pulses.     No systolic murmur is present   No diastolic murmur is present  Pulses:      Radial pulses are 2+ on the right side, and 2+ on the left side.  Pulmonary/Chest: Effort normal and breath sounds normal. He has no wheezes. He has no rhonchi. He has no rales. He exhibits no tenderness.  Abdominal: Soft. Normal appearance and bowel sounds are normal. There is no tenderness. There is no CVA tenderness and negative Murphy's sign.  Musculoskeletal:       BLE:s Calves nontender, no cords or erythema, negative Homans sign  Neurological: He is  alert and oriented to person, place, and time. He has normal strength. No cranial nerve deficit or sensory deficit. GCS eye subscore is 4. GCS verbal subscore is 5. GCS motor subscore is 6.  Skin: Skin is warm and dry. No rash noted. He is not diaphoretic.  Psychiatric: His speech is normal.       Cooperative and appropriate    ED Course  Procedures (including critical care time)  Results for orders placed during the hospital encounter of 10/09/11  CBC      Component Value Range   WBC 12.7 (*) 4.0 - 10.5 (K/uL)   RBC 4.03 (*) 4.22 - 5.81 (MIL/uL)   Hemoglobin 13.2  13.0 - 17.0 (g/dL)   HCT 13.0 (*) 86.5 - 52.0 (%)   MCV 94.3  78.0 - 100.0 (fL)   MCH 32.8  26.0 - 34.0 (pg)   MCHC 34.7  30.0 - 36.0 (g/dL)   RDW 78.4  69.6 - 29.5 (%)   Platelets 226  150 - 400 (K/uL)  COMPREHENSIVE METABOLIC PANEL      Component Value Range   Sodium 140  135 - 145 (mEq/L)   Potassium 4.1  3.5 - 5.1 (mEq/L)   Chloride 104  96 - 112 (mEq/L)   CO2 27  19 - 32 (mEq/L)   Glucose, Bld 131 (*) 70 - 99 (mg/dL)   BUN 37 (*) 6 - 23 (mg/dL)   Creatinine, Ser 2.84 (*) 0.50 - 1.35 (mg/dL)   Calcium 9.1  8.4 - 13.2 (mg/dL)   Total Protein 7.3  6.0 - 8.3 (g/dL)   Albumin 3.4 (*) 3.5 - 5.2 (g/dL)   AST 17  0 - 37 (U/L)   ALT 12  0 - 53 (U/L)   Alkaline Phosphatase 93  39 - 117 (U/L)   Total Bilirubin 0.3  0.3 - 1.2 (mg/dL)   GFR calc non Af Amer 21 (*) >90 (mL/min)   GFR calc Af Amer 24 (*) >90 (mL/min)  POCT I-STAT, CHEM 8      Component Value Range   Sodium 144  135 - 145 (mEq/L)   Potassium 4.2  3.5 - 5.1 (mEq/L)   Chloride 108  96 - 112 (mEq/L)   BUN 38 (*) 6 - 23 (mg/dL)   Creatinine, Ser 4.40 (*) 0.50 - 1.35 (mg/dL)   Glucose, Bld 102 (*) 70 - 99 (mg/dL)   Calcium, Ion 7.25  3.66 - 1.32 (mmol/L)   TCO2 25  0 - 100 (mmol/L)   Hemoglobin 13.6  13.0 - 17.0 (g/dL)   HCT 44.0  34.7 - 42.5 (%)  POCT I-STAT TROPONIN I      Component Value Range   Troponin i, poc 0.00  0.00 - 0.08 (ng/mL)  Comment 3             Dg Chest Portable 1 View  10/10/2011  *RADIOLOGY REPORT*  Clinical Data: Near syncope.  PORTABLE CHEST - 1 VIEW  Comparison: 02/22/2009  Findings: Stable postoperative changes in the mediastinum.  Shallow inspiration.  Borderline heart size and pulmonary vascularity are likely normal for technique.  There is a suggestion of mild blunting of the left costophrenic angle with atelectasis in the left lung base.  No focal airspace consolidation.  No pneumothorax. Tortuous aorta.  IMPRESSION: Shallow inspiration with small left pleural effusion and infiltration or atelectasis in the left lung base.  Original Report Authenticated By: Marlon Pel, M.D.    Date: 10/10/2011  Rate: 54  Rhythm: Sinus with bigeminy  QRS Axis: left  Intervals: normal  ST/T Wave abnormalities: nonspecific ST changes  Conduction Disutrbances:none  Narrative Interpretation: Previous EKG dated 02/22/2009 demonstrates sinus without bigeminy  Old EKG Reviewed: changes noted  Aspirin provided for symptoms similar to previous CABG. EKG obtained and reviewed as above. X-ray labs reviewed as above. Case discussed with cardiologist on call, Dr Margo Aye, who agrees to evaluation the ED for  admit.  MDM   Diaphoresis, nausea and dizziness with history of similar symptoms due to ACS. Workup as above with cardiology consult for admission. Remains asymptomatic in the emergency department.       Sunnie Nielsen, MD 10/10/11 [phone]

## 2011-10-09 NOTE — ED Notes (Signed)
Per EMS: pt went from sitting to standing took a few steps and felt dizzy, and almost passed out. Pt did not lose consciousness, but got SOB, Nauseated and diaphoretic. Pt currently clammy no longer SOB or Nauseated. Pt not orthostatic.

## 2011-10-10 ENCOUNTER — Encounter (HOSPITAL_COMMUNITY): Payer: Self-pay | Admitting: *Deleted

## 2011-10-10 LAB — BASIC METABOLIC PANEL
CO2: 24 mEq/L (ref 19–32)
Calcium: 8.7 mg/dL (ref 8.4–10.5)
Chloride: 108 mEq/L (ref 96–112)
Creatinine, Ser: 2.37 mg/dL — ABNORMAL HIGH (ref 0.50–1.35)
Glucose, Bld: 114 mg/dL — ABNORMAL HIGH (ref 70–99)

## 2011-10-10 LAB — LIPID PANEL
HDL: 35 mg/dL — ABNORMAL LOW (ref >39)
LDL Cholesterol: 91 mg/dL (ref 0–99)
Triglycerides: 138 mg/dL (ref <150)

## 2011-10-10 LAB — CBC
HCT: 35.5 % — ABNORMAL LOW (ref 39.0–52.0)
MCH: 33 pg (ref 26.0–34.0)
MCV: 95.2 fL (ref 78.0–100.0)
RBC: 3.73 MIL/uL — ABNORMAL LOW (ref 4.22–5.81)
WBC: 11.2 10*3/uL — ABNORMAL HIGH (ref 4.0–10.5)

## 2011-10-10 LAB — CARDIAC PANEL(CRET KIN+CKTOT+MB+TROPI)
Relative Index: INVALID (ref 0.0–2.5)
Relative Index: INVALID (ref 0.0–2.5)
Relative Index: 2.2 (ref 0.0–2.5)
Total CK: 67 U/L (ref 7–232)
Total CK: 68 U/L (ref 7–232)

## 2011-10-10 MED ORDER — ASPIRIN 300 MG RE SUPP: 300.0000 mg | RECTAL | Status: DC

## 2011-10-10 MED ORDER — PANTOPRAZOLE SODIUM 40 MG PO TBEC
40.0000 mg | DELAYED_RELEASE_TABLET | Freq: Every day | ORAL | Status: DC
  Administered 2011-10-10: 40 mg via ORAL
  Filled 2011-10-10: qty 1

## 2011-10-10 MED ORDER — ACETAMINOPHEN 325 MG PO TABS
650.0000 mg | ORAL_TABLET | ORAL | Status: DC | PRN
  Administered 2011-10-10: 650 mg via ORAL
  Filled 2011-10-10: qty 2

## 2011-10-10 MED ORDER — HEPARIN SODIUM (PORCINE) 5000 UNIT/ML IJ SOLN
5000.0000 [IU] | Freq: Three times a day (TID) | INTRAMUSCULAR | Status: DC
  Administered 2011-10-10 (×2): 5000 [IU] via SUBCUTANEOUS
  Filled 2011-10-10 (×7): qty 1

## 2011-10-10 MED ORDER — ACETAMINOPHEN 325 MG PO TABS
650.0000 mg | ORAL_TABLET | Freq: Once | ORAL | Status: AC
  Administered 2011-10-10: 650 mg via ORAL
  Filled 2011-10-10: qty 2

## 2011-10-10 MED ORDER — ASPIRIN 81 MG PO CHEW: 324.0000 mg | CHEWABLE_TABLET | ORAL | Status: DC

## 2011-10-10 MED ORDER — SIMVASTATIN 20 MG PO TABS
20.0000 mg | ORAL_TABLET | Freq: Every day | ORAL | Status: DC
  Administered 2011-10-10: 20 mg via ORAL
  Filled 2011-10-10 (×2): qty 1

## 2011-10-10 MED ORDER — OMEGA-3 FATTY ACIDS 1000 MG PO CAPS
1.0000 g | ORAL_CAPSULE | Freq: Every day | ORAL | Status: DC
Start: 2011-10-10 — End: 2011-10-10

## 2011-10-10 MED ORDER — OMEGA-3-ACID ETHYL ESTERS 1 G PO CAPS
1.0000 g | ORAL_CAPSULE | Freq: Every day | ORAL | Status: DC
  Administered 2011-10-10 – 2011-10-11 (×2): 1 g via ORAL
  Filled 2011-10-10 (×2): qty 1

## 2011-10-10 MED ORDER — ASPIRIN 81 MG PO CHEW
CHEWABLE_TABLET | ORAL | Status: AC
  Administered 2011-10-10: 162 mg
  Filled 2011-10-10: qty 2

## 2011-10-10 MED ORDER — ASPIRIN EC 81 MG PO TBEC
81.0000 mg | DELAYED_RELEASE_TABLET | Freq: Every day | ORAL | Status: DC
  Administered 2011-10-11: 81 mg via ORAL
  Filled 2011-10-10: qty 1

## 2011-10-10 MED ORDER — ONDANSETRON HCL 4 MG/2ML IJ SOLN: 4.0000 mg | Freq: Four times a day (QID) | INTRAMUSCULAR | Status: DC | PRN

## 2011-10-10 MED ORDER — AMLODIPINE BESYLATE 5 MG PO TABS
5.0000 mg | ORAL_TABLET | Freq: Every day | ORAL | Status: DC
  Administered 2011-10-10 – 2011-10-11 (×2): 5 mg via ORAL
  Filled 2011-10-10 (×2): qty 1

## 2011-10-10 MED ORDER — SERTRALINE HCL 50 MG PO TABS
50.0000 mg | ORAL_TABLET | Freq: Every day | ORAL | Status: DC
  Administered 2011-10-10 – 2011-10-11 (×2): 50 mg via ORAL
  Filled 2011-10-10 (×2): qty 1

## 2011-10-10 MED ORDER — LEVOTHYROXINE SODIUM 112 MCG PO TABS
112.0000 ug | ORAL_TABLET | Freq: Every day | ORAL | Status: DC
  Administered 2011-10-10 – 2011-10-11 (×2): 112 ug via ORAL
  Filled 2011-10-10 (×2): qty 1

## 2011-10-10 MED ORDER — ASPIRIN EC 81 MG PO TBEC
81.0000 mg | DELAYED_RELEASE_TABLET | Freq: Every day | ORAL | Status: DC
  Administered 2011-10-10: 81 mg via ORAL
  Filled 2011-10-10 (×2): qty 1

## 2011-10-10 MED ORDER — TRIAMTERENE-HCTZ 75-50 MG PO TABS
0.5000 | ORAL_TABLET | Freq: Every day | ORAL | Status: DC
  Administered 2011-10-10 – 2011-10-11 (×2): 0.5 via ORAL
  Filled 2011-10-10 (×2): qty 0.5

## 2011-10-10 MED ORDER — ADULT MULTIVITAMIN W/MINERALS CH
1.0000 | ORAL_TABLET | Freq: Every day | ORAL | Status: DC
  Administered 2011-10-10 – 2011-10-11 (×2): 1 via ORAL
  Filled 2011-10-10 (×2): qty 1

## 2011-10-10 MED ORDER — NITROGLYCERIN 0.4 MG SL SUBL: 0.4000 mg | SUBLINGUAL_TABLET | SUBLINGUAL | Status: DC | PRN

## 2011-10-10 NOTE — H&P (Signed)
NAMEAMRO, WINEBARGER NO.:  [id number]  MEDICAL RECORD NO.:  [id number]  LOCATION:  [facility]                        FACILITY:  [facility]  PHYSICIAN:  Natasha Bence, MD       DATE OF BIRTH:  15-Apr-1921  DATE OF ADMISSION:  10/09/2011 DATE OF DISCHARGE:                             HISTORY & PHYSICAL   CHIEF COMPLAINT:  Lightheadedness and diaphoresis and presyncope.  HISTORY OF PRESENT ILLNESS:  [name] is an 78 year old white male with history of known coronary artery disease status post bypass x2, who presented with a chief complaint of almost passing out.  He reports he has been doing fairly well, and then this evening, got up from watching television and walked a few feet and became very hot and flushed feeling.  He then became lightheaded and then nearly passed out.  He did not lose consciousness.  He did not develop frank chest discomfort.  He also denied shortness of breath.  He was very concerned about these symptoms, as he had similar episodes prior to these 2 bypass surgeries. Right now, he feels well except for headache.  He checked his blood pressure shortly after this episode at home and his blood pressure was in the 170 systolic.  Here, he has had significant blood pressure elevations with systolic pressures running for 150s to 330s.  He reports no blurry vision, but he does have a headache.  He denies any nausea or vomiting, otherwise he has not had any recent chest discomfort.  He denies any PND or orthopnea.  He has not had any palpitations.  He does feel his heart skip a beat on occasion.  He otherwise denies any fevers, chills, or sweats.  No bleeding or melena.  REVIEW OF SYSTEMS:  Rest of complete review of systems was performed was negative except for what is stated above.  PAST MEDICAL HISTORY: 1. Coronary artery disease status post bypass grafting x2.  Last CABG     was in 2005. 2. Hypertension. 3. Chronic kidney disease. 4. TIA. 5.  Hypothyroidism. 6. Anxiety. 7. Dyslipidemia. 8. Osteoarthritis.  PAST SURGICAL HISTORY: 1. He has had coronary artery bypass grafting x2, last one in 2005. 2. Hernia repair. 3. TURP.  SOCIAL HISTORY:  He is a former smoker but quit smoking years ago.  He had a greater than 20-pack year history.  Drinks alcohol on occasion. Denies any illicit drug use.  FAMILY HISTORY:  Father died in his 46s from kidney disease.  He also had heart disease.  Mother died in her 11s of heart failure.  ALLERGIC:  CODEINE.  HOME MEDICATIONS: 1. Tylenol p.r.n. 2. Vitamin C p.o. daily. 3. Aspirin 81 mg daily. 4. Fish oil. 5. Omega-3 fatty acids 1 g p.o. daily. 6. Synthroid 112 mcg p.o. daily.\ 7. Multivitamin daily. 8. Omeprazole 20 mg p.o. daily. 9. Pravastatin 80 mg p.o. daily. 10.Zoloft 50 mg p.o. daily. 11.Maxzide 75/50 mg p.o. daily.  PHYSICAL EXAMINATION:  VITAL SIGNS:  He is afebrile.  Temperature 97.5, pulse is 64 and regular, respiratory rate 23, blood pressure 172/75, O2 sats 99% on room air. GENERAL:  He is a well-developed, well-nourished white male, who looks younger  than his stated age. EYES:  He has anicteric sclerae. NECK:  He has normal jugular venous pressure.  No carotid bruits. HENT:  Mucous membranes are moist. LUNGS:  Clear auscultation bilaterally. CARDIOVASCULAR:  He has a 3/6 squeaky systolic murmur heard best at his apex.  No rubs or gallops. ABDOMEN:  Soft, nontender, nondistended.  No masses or bruits. EXTREMITIES:  Warm with no edema.  He has symmetrical pulses throughout. SKIN:  No rashes or ulcers. NEUROLOGIC:  Grossly afocal.  Symmetrical strength throughout.  LABORATORY DATA:  Sodium 144, potassium 4.2, chloride 108, bicarb of 38, BUN of 38, creatinine 2.5, glucose is 128.  Troponin 0.00.  White blood cell count was 12.7, hematocrit 38, platelet count was 226.  IMAGING:  Chest x-ray shows no acute infiltrate or effusion.  He has a tiny left pleural  effusion.  EKG shows sinus mechanism with PVCs, otherwise no acute ischemic changes.  IMPRESSION AND PLAN:  This is an 78 year old white male with known coronary artery disease, who presents with primary complaint of lightheadedness, diaphoresis, and presyncope.  He has not had any frank chest discomfort, however, he is concerned with these symptoms as he had these prior to both his bypass surgeries.  Of note, he is also profoundly hypertensive with systolic pressures up to 180s.  He has a very wide pulse pressure as well.  He also has a new murmur on exam.  It has not been described in previous notes.  We will obtain an echocardiogram in the morning.  For his hypertension, we will also add Norvasc 5 mg tonight to his regimen.  He says his blood pressure usually runs in the 140s at home when he checks it.  Currently, he is feeling his usual self.  We will admit him to telemetry and monitor him with serial cardiac enzymes otherwise.  Follow up echocardiogram          ______________________________ Natasha Bence, MD     MH/MEDQ  D:  10/10/2011  T:  10/10/2011  Job:  [id number]

## 2011-10-10 NOTE — Progress Notes (Signed)
Subjective:  78 year old with near syncope, diaphoresis that prompted ED visit.   Currently feeling well. No further complaints, no CP, no SOB, no dizziness. Got up to go to the bathroom no difficulty.  Objective:  Vital Signs in the last 24 hours: Temp:  [97.5 F (36.4 C)-98.5 F (36.9 C)] 98.5 F (36.9 C) (06/02 0600) Pulse Rate:  [62-80] 68  (06/02 0600) Resp:  [13-27] 13  (06/02 0600) BP: (139-174)/(66-82) 141/68 mmHg (06/02 0600) SpO2:  [96 %-99 %] 99 % (06/02 0600) Weight:  [76.5 kg (168 lb 10.4 oz)] 76.5 kg (168 lb 10.4 oz) (06/02 0330)  Intake/Output from previous day:     Physical Exam: General: Well developed, well nourished, in no acute distress. Head:  Normocephalic and atraumatic. Lungs: Clear to auscultation and percussion. Heart: Normal S1 and S2.  3/6 musical murmur RUSB,no rubs or gallops.  Pulses: Pulses normal in all 4 extremities. Abdomen: soft, non-tender, positive bowel sounds. Extremities: No clubbing or cyanosis. No edema. Neurologic: Alert and oriented x 3.    Lab Results:  Basename 10/10/11 0441 10/09/11 2334 10/09/11 2314  WBC 11.2* -- 12.7*  HGB 12.3* 13.6 --  PLT 218 -- 226    Basename 10/10/11 0441 10/09/11 2334 10/09/11 2314  NA 142 144 --  K 4.5 4.2 --  CL 108 108 --  CO2 24 -- 27  GLUCOSE 114* 128* --  BUN 35* 38* --  CREATININE 2.37* 2.50* --    Basename 10/10/11 0725  TROPONINI <0.30   Hepatic Function Panel  Basename 10/09/11 2314  PROT 7.3  ALBUMIN 3.4*  AST 17  ALT 12  ALKPHOS 93  BILITOT 0.3  BILIDIR --  IBILI --    Basename 10/10/11 0441  CHOL 154   No results found for this basename: PROTIME in the last 72 hours  Imaging: Dg Chest Portable 1 View  10/10/2011  *RADIOLOGY REPORT*  Clinical Data: Near syncope.  PORTABLE CHEST - 1 VIEW  Comparison: 02/22/2009  Findings: Stable postoperative changes in the mediastinum.  Shallow inspiration.  Borderline heart size and pulmonary vascularity are likely normal for  technique.  There is a suggestion of mild blunting of the left costophrenic angle with atelectasis in the left lung base.  No focal airspace consolidation.  No pneumothorax. Tortuous aorta.  IMPRESSION: Shallow inspiration with small left pleural effusion and infiltration or atelectasis in the left lung base.  Original Report Authenticated By: Marlon Pel, M.D.   Personally viewed.   Telemetry: Likely SR with PAC's but very challenging to see P wave.  Personally viewed.   EKG:  SR with PAC's.   Cardiac Studies:  Await ECHO  Assessment/Plan:   78 year old with CAD CABG x 2, near syncope, murmur.   Near syncope  - No pauses overnight on tele.  - Possible vagal event  - Troponin negative  - Since he came in very late last night, CAD, advanced age, will observe at least 24hours which will bring to possible DC tomorrow am.   - Could consider Event monitor as outpt.   Murmur  - ECHO pending. ? AS  CKD 3-4  - stable 2.4  Dispo tomorrow hopefully per [name].      Brunetta Newingham 10/10/2011, 10:52 AM

## 2011-10-10 NOTE — Progress Notes (Signed)
*  PRELIMINARY RESULTS* Echocardiogram 2D Echocardiogram has been performed.  Taiwo Fish, Real Cons 10/10/2011, 11:11 AM

## 2011-10-10 NOTE — ED Notes (Signed)
Pt SBP of 180 and c/o headache, though the headache pain has not increased.  Dr Dierdre Highman stated to give acetaminophen and await cardiology.

## 2011-10-11 ENCOUNTER — Encounter (HOSPITAL_COMMUNITY): Payer: Self-pay | Admitting: Cardiology

## 2011-10-11 DIAGNOSIS — Z8673 Personal history of transient ischemic attack (TIA), and cerebral infarction without residual deficits: Secondary | ICD-10-CM

## 2011-10-11 DIAGNOSIS — M1990 Unspecified osteoarthritis, unspecified site: Secondary | ICD-10-CM

## 2011-10-11 DIAGNOSIS — N184 Chronic kidney disease, stage 4 (severe): Secondary | ICD-10-CM

## 2011-10-11 DIAGNOSIS — I119 Hypertensive heart disease without heart failure: Secondary | ICD-10-CM

## 2011-10-11 DIAGNOSIS — M519 Unspecified thoracic, thoracolumbar and lumbosacral intervertebral disc disorder: Secondary | ICD-10-CM

## 2011-10-11 DIAGNOSIS — E785 Hyperlipidemia, unspecified: Secondary | ICD-10-CM

## 2011-10-11 DIAGNOSIS — E039 Hypothyroidism, unspecified: Secondary | ICD-10-CM

## 2011-10-11 DIAGNOSIS — I2510 Atherosclerotic heart disease of native coronary artery without angina pectoris: Secondary | ICD-10-CM

## 2011-10-11 MED ORDER — NITROGLYCERIN 0.4 MG SL SUBL: 0.4000 mg | SUBLINGUAL_TABLET | SUBLINGUAL | Status: DC | PRN

## 2011-10-11 MED ORDER — AMLODIPINE BESYLATE 5 MG PO TABS: 5.0000 mg | ORAL_TABLET | Freq: Every day | ORAL | Status: DC

## 2011-10-11 MED ORDER — AMLODIPINE BESYLATE 5 MG PO TABS
5.0000 mg | ORAL_TABLET | Freq: Every day | ORAL | Status: DC
  Filled 2011-10-11: qty 1

## 2011-10-11 MED ORDER — AMLODIPINE BESYLATE 5 MG PO TABS
5.0000 mg | ORAL_TABLET | Freq: Two times a day (BID) | ORAL | Status: DC
  Filled 2011-10-11 (×2): qty 1

## 2011-10-11 NOTE — Progress Notes (Signed)
Utilization review complete 

## 2011-10-11 NOTE — Discharge Instructions (Signed)
Begin checking blood pressures at home.  Begin norvasc (amlodipine) 5 mg daily.      Nitrofurantoin tablets or capsules What is this medicine? NITROFURANTOIN (nye troe fyoor AN toyn) is an antibiotic. It is used to treat urinary tract infections. This medicine may be used for other purposes; ask your health care provider or pharmacist if you have questions. What should I tell my health care provider before I take this medicine? They need to know if you have any of these conditions: -anemia -diabetes -glucose-6-phosphate dehydrogenase deficiency -kidney disease -liver disease -lung disease -other chronic illness -an unusual or allergic reaction to nitrofurantoin, other antibiotics, other medicines, foods, dyes or preservatives -pregnant or trying to get pregnant -breast-feeding How should I use this medicine? Take this medicine by mouth with a glass of water. Follow the directions on the prescription label. Take this medicine with food or milk. Take your doses at regular intervals. Do not take your medicine more often than directed. Do not stop taking except on your doctor's advice. Talk to your pediatrician regarding the use of this medicine in children. While this drug may be prescribed for selected conditions, precautions do apply. Overdosage: If you think you have taken too much of this medicine contact a poison control center or emergency room at once. NOTE: This medicine is only for you. Do not share this medicine with others. What if I miss a dose? If you miss a dose, take it as soon as you can. If it is almost time for your next dose, take only that dose. Do not take double or extra doses. What may interact with this medicine? -antacids containing magnesium trisilicate -probenecid -quinolone antibiotics like ciprofloxacin, lomefloxacin, norfloxacin and ofloxacin -sulfinpyrazone This list may not describe all possible interactions. Give your health care provider a list of all  the medicines, herbs, non-prescription drugs, or dietary supplements you use. Also tell them if you smoke, drink alcohol, or use illegal drugs. Some items may interact with your medicine. What should I watch for while using this medicine? Tell your doctor or health care professional if your symptoms do not improve or if you get new symptoms. Drink several glasses of water a day. If you are taking this medicine for a long time, visit your doctor for regular checks on your progress. If you are diabetic, you may get a false positive result for sugar in your urine with certain brands of urine tests. Check with your doctor. What side effects may I notice from receiving this medicine? Side effects that you should report to your doctor or health care professional as soon as possible: -allergic reactions like skin rash or hives, swelling of the face, lips, or tongue -chest pain -cough -difficulty breathing -dizziness, drowsiness -fever or infection -joint aches or pains -pale or blue-tinted skin -redness, blistering, peeling or loosening of the skin, including inside the mouth -tingling, burning, pain, or numbness in hands or feet -unusual bleeding or bruising -unusually weak or tired -yellowing of eyes or skin Side effects that usually do not require medical attention (report to your doctor or health care professional if they continue or are bothersome): -dark urine -diarrhea -headache -loss of appetite -nausea or vomiting -temporary hair loss This list may not describe all possible side effects. Call your doctor for medical advice about side effects. You may report side effects to FDA at 1-800-FDA-1088. Where should I keep my medicine? Keep out of the reach of children. Store at room temperature between 15 and 30 degrees C (  59 and 86 degrees F). Protect from light. Throw away any unused medicine after the expiration date. NOTE: This sheet is a summary. It may not cover all possible  information. If you have questions about this medicine, talk to your doctor, pharmacist, or health care provider.  2012, Elsevier/Gold Standard. (11/15/2007 3:56:47 PM)    Amlodipine tablets What is this medicine? AMLODIPINE (am LOE di peen) is a calcium-channel blocker. It affects the amount of calcium found in your heart and muscle cells. This relaxes your blood vessels, which can reduce the amount of work the heart has to do. This medicine is used to lower high blood pressure. It is also used to prevent chest pain. This medicine may be used for other purposes; ask your health care provider or pharmacist if you have questions. What should I tell my health care provider before I take this medicine? They need to know if you have any of these conditions: -heart problems like heart failure or aortic stenosis -liver disease -an unusual or allergic reaction to amlodipine, other medicines, foods, dyes, or preservatives -pregnant or trying to get pregnant -breast-feeding How should I use this medicine? Take this medicine by mouth with a glass of water. Follow the directions on the prescription label. You can take the tablets with or without food. Do not change the amount of grapefruit juice you drink from day to day while taking this drug, or avoid grapefruit juice altogether. Take your medicine at regular intervals. Do not take more medicine than directed. Talk to your pediatrician regarding the use of this medicine in children. Special care may be needed. This medicine has been used in children as young as 6. Persons over 63 years old may have a stronger reaction to this medicine and need smaller doses. Overdosage: If you think you have taken too much of this medicine contact a poison control center or emergency room at once. NOTE: This medicine is only for you. Do not share this medicine with others. What if I miss a dose? If you miss a dose, take it as soon as you can. If it is almost time for  your next dose, take only that dose. Do not take double or extra doses. What may interact with this medicine? -herbal or dietary supplements -local or general anesthetics -medicines for high blood pressure -medicines for prostate problems -rifampin This list may not describe all possible interactions. Give your health care provider a list of all the medicines, herbs, non-prescription drugs, or dietary supplements you use. Also tell them if you smoke, drink alcohol, or use illegal drugs. Some items may interact with your medicine. What should I watch for while using this medicine? Visit your doctor or health care professional for regular check ups. Check your blood pressure and pulse rate regularly. Ask your health care professional what your blood pressure and pulse rate should be, and when you should contact him or her. This medicine may make you feel confused, dizzy or lightheaded. Do not drive, use machinery, or do anything that needs mental alertness until you know how this medicine affects you. To reduce the risk of dizzy or fainting spells, do not sit or stand up quickly, especially if you are an older patient. Avoid alcoholic drinks; they can make you more dizzy. Do not suddenly stop taking amlodipine. Ask your doctor or health care professional how you can gradually reduce the dose. What side effects may I notice from receiving this medicine? Side effects that you should report to your  doctor or health care professional as soon as possible: -allergic reactions like skin rash, itching or hives, swelling of the face, lips, or tongue -breathing problems -changes in vision or hearing -chest pain -fast, irregular heartbeat -swelling of legs or ankles Side effects that usually do not require medical attention (report to your doctor or health care professional if they continue or are bothersome): -dry mouth -facial flushing -nausea, vomiting -stomach gas, pain -tired, weak -trouble  sleeping This list may not describe all possible side effects. Call your doctor for medical advice about side effects. You may report side effects to FDA at 1-800-FDA-1088. Where should I keep my medicine? Keep out of the reach of children. Store at room temperature between 59 and 86 degrees F (15 and 30 degrees C). Protect from light. Keep container tightly closed. Throw away any unused medicine after the expiration date. NOTE: This sheet is a summary. It may not cover all possible information. If you have questions about this medicine, talk to your doctor, pharmacist, or health care provider.  2012, Elsevier/Gold Standard. (07/07/2007 4:01:50 PM)

## 2011-10-11 NOTE — Discharge Summary (Signed)
Physician Discharge Summary   Patient ID:  [name]  MRN: [id number]  DOB/AGE: 1920/06/19 78 y.o.  Admit date: 10/09/2011  Discharge date: 10/11/2011   Primary Physician:  [facility]   Primary Discharge Diagnosis:  1. Dizziness and presyncope of uncertain etiology-myocardial infarction ruled out, no bradycardia arrhythmias noted   Secondary Discharge Diagnosis:  2. Coronary artery disease with previous redo bypass grafting  3. Hypertensive heart disease with uncontrolled blood pressure  4. Hypothyroidism under treatment  5. Stage 4 chronic kidney disease  6. History of stroke and TIA   Procedures: Echocardiogram   Hospital Course:  This 78 year old male has a history of known coronary artery disease with previous bypass grafting. He was watching television the day of admission and after getting a became hot and flushed lightheaded and had a near syncopal episode but did not lose consciousness. He denied shortness of breath or chest pain. He was checking his blood pressure at home and found to be fairly significant and had elevated blood pressure while in the emergency room. He was admitted to the hospital for further evaluation  He had negative serial cardiac enzymes. Telemetry monitoring did not show evidence of any tachycardia or bradycardia. He had no significant chest discomfort. His echocardiogram showed preserved LV function with LVH. He does have known stage IV chronic kidney disease but has been stable with this. Amlodipine 5 mg is added to his regimen and he is able to be discharged at this time in improved condition. His blood pressure is still somewhat elevated but he is advised to check his blood pressure as an outpatient and we will follow him up at home. If he has recurrent dizziness he may need to wear a cardiac event monitor.   Discharge Exam:  Blood pressure 160/81, pulse 69, temperature 98.3 F (36.8 C), resp. rate 15, height 5\' 10"  (1.778 m), weight  76.5 kg (168 lb 10.4 oz), SpO2 98.00%.  Lungs clear, no S3   Labs:  CBC:  Lab Results   Component  Value  Date    WBC  11.2*  10/10/2011    HGB  12.3*  10/10/2011    HCT  35.5*  10/10/2011    MCV  95.2  10/10/2011    PLT  218  10/10/2011    CMP:  Lab  10/10/11 0441  10/09/11 2314   NA  142  --   K  4.5  --   CL  108  --   CO2  24  --   BUN  35*  --   CREATININE  2.37*  --   CALCIUM  8.7  --   PROT  --  7.3   BILITOT  --  0.3   ALKPHOS  --  93   ALT  --  12   AST  --  17   GLUCOSE  114*  --    Lipid Panel    Component  Value  Date/Time    CHOL  154  10/10/2011 0441    TRIG  138  10/10/2011 0441    HDL  35*  10/10/2011 0441    CHOLHDL  4.4  10/10/2011 0441    VLDL  28  10/10/2011 0441    LDLCALC  91  10/10/2011 0441    Cardiac Enzymes:   Basename  10/10/11 2118  10/10/11 1230  10/10/11 0725   CKTOTAL  116  68  67   CKMB  2.6  2.3  2.3  CKMBINDEX  --  --  --   TROPONINI  <0.30  <0.30  <0.30    Radiology:  Left base atelectasis,   EKG:  Sinus rhythm with PACs. IV conduction delay. Old inferior infarction  Discharge Medications:  Samad, Thon  Home Medication Instructions ZOX:[id number]   Printed on:10/11/11 1011  Medication Information                    levothyroxine (SYNTHROID, LEVOTHROID) 112 MCG tablet Take 112 mcg by mouth daily.           sertraline (ZOLOFT) 100 MG tablet Take 50 mg by mouth daily.           pravastatin (PRAVACHOL) 80 MG tablet Take 80 mg by mouth daily.           triamterene-hydrochlorothiazide (MAXZIDE) 75-50 MG per tablet Take 0.5 tablets by mouth daily.           omeprazole (PRILOSEC) 20 MG capsule Take 20 mg by mouth daily.           fish oil-omega-3 fatty acids 1000 MG capsule Take 1 g by mouth daily.           aspirin EC 81 MG tablet Take 81 mg by mouth daily.           Multiple Vitamin (MULITIVITAMIN WITH MINERALS) TABS Take 1 tablet by mouth daily.           acetaminophen (TYLENOL) 500 MG tablet Take 1,000 mg by mouth every  6 (six) hours as needed. For pain           cholecalciferol (VITAMIN D) 1000 UNITS tablet Take 1,000 Units by mouth daily.           vitamin E 400 UNIT capsule Take 400 Units by mouth daily.           vitamin B-12 (CYANOCOBALAMIN) 1000 MCG tablet Take 1,000 mcg by mouth daily.           Ascorbic Acid (VITAMIN C) 1000 MG tablet Take 1,000 mg by mouth daily.           ferrous sulfate 325 (65 FE) MG tablet Take 325 mg by mouth daily with breakfast.           nitroGLYCERIN (NITROSTAT) 0.4 MG SL tablet Place 1 tablet (0.4 mg total) under the tongue every 5 (five) minutes x 3 doses as needed for chest pain.           amLODipine (NORVASC) 5 MG tablet Take 1 tablet (5 mg total) by mouth daily.             Followup plans and appointments:  [name] in one week. He is also advised to check his home blood pressure particularly when he is standing because of the dizziness.   Time spent with patient to include physician time: 35 minutes   Signed:  W. Ashley Royalty MD [facility]  Cardiology   10/11/2011, 10:11 AM

## 2012-07-24 ENCOUNTER — Encounter: Payer: Self-pay | Admitting: Vascular Surgery

## 2012-07-24 ENCOUNTER — Other Ambulatory Visit: Payer: Self-pay

## 2012-07-24 DIAGNOSIS — Z01810 Encounter for preprocedural cardiovascular examination: Secondary | ICD-10-CM

## 2012-07-24 DIAGNOSIS — N186 End stage renal disease: Secondary | ICD-10-CM

## 2012-08-14 ENCOUNTER — Encounter: Payer: Self-pay | Admitting: Vascular Surgery

## 2012-08-15 ENCOUNTER — Encounter: Payer: Self-pay | Admitting: Vascular Surgery

## 2012-08-15 ENCOUNTER — Other Ambulatory Visit: Payer: Self-pay | Admitting: *Deleted

## 2012-08-15 ENCOUNTER — Encounter (INDEPENDENT_AMBULATORY_CARE_PROVIDER_SITE_OTHER): Payer: Medicare Other | Admitting: *Deleted

## 2012-08-15 ENCOUNTER — Encounter: Payer: Self-pay | Admitting: *Deleted

## 2012-08-15 ENCOUNTER — Encounter (HOSPITAL_COMMUNITY): Payer: Self-pay | Admitting: Pharmacy Technician

## 2012-08-15 ENCOUNTER — Ambulatory Visit (INDEPENDENT_AMBULATORY_CARE_PROVIDER_SITE_OTHER): Payer: Medicare Other | Admitting: Vascular Surgery

## 2012-08-15 VITALS — BP 189/101 | HR 66 | Resp 18 | Ht 70.0 in | Wt 155.4 lb

## 2012-08-15 DIAGNOSIS — N186 End stage renal disease: Secondary | ICD-10-CM

## 2012-08-15 DIAGNOSIS — Z01810 Encounter for preprocedural cardiovascular examination: Secondary | ICD-10-CM

## 2012-08-15 NOTE — Progress Notes (Signed)
[facility] [place]   Patient name: [name] MRN: [id number] DOB: [birth year] Sex: male   Referred by: Caryn Section  Reason for referral:  Chief Complaint  Patient presents with  . New Evaluation    consult for placement of AVF     HISTORY OF PRESENT ILLNESS: The patient is a for discussion of AV access. He is here today with his son. He has progressive chronic renal insufficiency and is approaching need for hemodialysis. They have view the teaching sessions regarding AV access for hemodialysis. I again reviewed options to include hemodialysis catheter, AV graft and AV fistula. He is in failing health and has multiple questions as to whether or hemodialysis will improve his memory, stamina and stamering with his speech. I explained that a hemodialysis and really had no impact on all these issues.  Past Medical History  Diagnosis Date  . MI (myocardial infarction)   . Stroke   . Hypertension   . CAD (coronary artery disease)     Prior CABG x 2 last in 2005    . Hypertensive heart disease without CHF   . Chronic kidney disease (CKD), stage IV (severe)   . Hyperlipidemia   . Hypothyroidism   . History of TIA (transient ischemic attack)   . Lumbar disc disease   . Osteoarthritis   . Hypothyroidism     Past Surgical History  Procedure Laterality Date  . Coronary artery bypass graft  2005  . Coronary stent placement    . Coronary artery bypass graft    . Cardiac catheterization      stents  . Hernia repair    . Joint replacement Left 04/2005  . Kidney cyst removal Right 08/2008  . Transurethral resection of prostate    . Cataract surgery Left 2012    History   Social History  . Marital Status: Married    Spouse Name: N/A    Number of Children: N/A  . Years of Education: N/A   Occupational History  . Not on file.   Social History Main Topics  . Smoking status: Former Games developer  . Smokeless tobacco: Never Used  . Alcohol Use: No  . Drug Use: No   . Sexually Active: Yes   Other Topics Concern  . Not on file   Social History Narrative  . No narrative on file    History reviewed. No pertinent family history.  Allergies as of 08/15/2012 - Review Complete 08/15/2012  Allergen Reaction Noted  . Codeine  10/09/2011    Current Outpatient Prescriptions on File Prior to Visit  Medication Sig Dispense Refill  . acetaminophen (TYLENOL) 500 MG tablet Take 1,000 mg by mouth every 6 (six) hours as needed. For pain      . amLODipine (NORVASC) 5 MG tablet Take by mouth daily.      . Ascorbic Acid (VITAMIN C) 1000 MG tablet Take 1,000 mg by mouth daily.      Marland Kitchen aspirin EC 81 MG tablet Take 81 mg by mouth daily.      . Calcium Carbonate-Vitamin D (CALCIUM + D PO) Take 1,200 mg by mouth daily.      . cholecalciferol (VITAMIN D) 1000 UNITS tablet Take 1,000 Units by mouth daily.      . fish oil-omega-3 fatty acids 1000 MG capsule Take 1 g by mouth daily.      Marland Kitchen levothyroxine (SYNTHROID, LEVOTHROID) 112 MCG tablet Take 100 mcg by mouth daily.       Marland Kitchen  Multiple Vitamin (MULITIVITAMIN WITH MINERALS) TABS Take 1 tablet by mouth daily.      . nitroGLYCERIN (NITROSTAT) 0.4 MG SL tablet Place 1 tablet (0.4 mg total) under the tongue every 5 (five) minutes x 3 doses as needed for chest pain.      Marland Kitchen omeprazole (PRILOSEC) 20 MG capsule Take 20 mg by mouth daily.      . pravastatin (PRAVACHOL) 80 MG tablet Take 80 mg by mouth daily.      . sertraline (ZOLOFT) 100 MG tablet Take 50 mg by mouth daily.      Marland Kitchen triamterene-hydrochlorothiazide (MAXZIDE) 75-50 MG per tablet Take 0.5 tablets by mouth daily.      . vitamin B-12 (CYANOCOBALAMIN) 1000 MCG tablet Take 1,000 mcg by mouth daily.      . ferrous sulfate 325 (65 FE) MG tablet Take 325 mg by mouth daily with breakfast.      . fexofenadine (ALLEGRA) 180 MG tablet Take 180 mg by mouth daily.      . vitamin E 400 UNIT capsule Take 400 Units by mouth daily.       No current facility-administered medications  on file prior to visit.     REVIEW OF SYSTEMS:  Positives indicated with an "X"  CARDIOVASCULAR:  [ ]  chest pain   [ ]  chest pressure   [ ]  palpitations   [ ]  orthopnea   [ ]  dyspnea on exertion   [ ]  claudication   [ ]  rest pain   [ ]  DVT   [ ]  phlebitis PULMONARY:   [ ]  productive cough   [ ]  asthma   [ ]  wheezing NEUROLOGIC:   [ ]  weakness  [ ]  paresthesias  [ ]  aphasia  [ ]  amaurosis  [ ]  dizziness HEMATOLOGIC:   [ ]  bleeding problems   [ ]  clotting disorders MUSCULOSKELETAL:  [ ]  joint pain   [ ]  joint swelling GASTROINTESTINAL: [ ]   blood in stool  [ ]   hematemesis GENITOURINARY:  [ ]   dysuria  [ ]   hematuria PSYCHIATRIC:  [ ]  history of major depression INTEGUMENTARY:  [ ]  rashes  [ ]  ulcers CONSTITUTIONAL:  [ ]  fever   [ ]  chills  PHYSICAL EXAMINATION:  General: The patient is a well-nourished male, in no acute distress. Vital signs are BP 189/101  Pulse 66  Resp 18  Ht 5\' 10"  (1.778 m)  Wt 155 lb 6.4 oz (70.489 kg)  BMI 22.3 kg/m2 Pulmonary: There is a good air exchange bilaterally  Abdomen: Soft and non-tender  Musculoskeletal: There are no major deformities.  There is no significant extremity pain. Neurologic: No focal weakness or paresthesias are detected, Skin: There are no ulcer or rashes noted. Psychiatric: The patient has normal affect. Pulse status: 2+ radial pulses bilaterally. Moderate size cephalic vein at the antecubital space but otherwise small veins.   VVS Vascular Lab Studies:  Ordered and Independently Reviewed vein map revealed 2 mm in greater cephalic vein on the right and small cephalic vein on the left. The basilic vein was small bilaterally. I reimaged these areas myself with SonoSite he does appear to have adequate cephalic vein at the right antecubital space for AV fistula  Impression and Plan:  Arrest of chronic renal insufficiency in need for hemodialysis access. I have recommended right upper AV fistula creation. We have scheduled this  for outpatient surgery on 08/21/12 at the hospital.    Chibuikem Thang [facility] [place] Office: [phone]

## 2012-08-16 ENCOUNTER — Encounter (HOSPITAL_COMMUNITY): Payer: Self-pay | Admitting: *Deleted

## 2012-08-16 NOTE — Progress Notes (Addendum)
Last had Nitroglycerin 2-16yrs ago  Cardiologist is Dr.[name]-last visit unknown  Stress test done several yrs ago  Echo report in epic from 2013  Heart cath reports in epic from 2003/2005  Medical Md Dr.Burnette and Dr.Wilson in [place]  EKG in epic from 10-09-11

## 2012-08-18 NOTE — Progress Notes (Signed)
Patient instructed to arrive at short stay 3rd floor at 0700 instead of 0530 am. Patient stated "I like that!" Patient reminded to remain NPO after MN and to take the medications with a small sip of water told by PreAdmit Nurse. Patient verbalized understanding.

## 2012-08-18 NOTE — Progress Notes (Signed)
Anesthesia Chart Review:  Patient is a 79 year old male scheduled for insertion of a RUA AVGG by Dr. Arbie Cookey on 08/21/12.  He is scheduled to be a same day work-up.  He is not yet on hemodialysis.  History includes CKD stage IV, CAD/MI s/p CABG X 7 '93 andredo CABG '05 (RIMA to OM1, SVG to RDA; his sequential LIMA to DIAG and LAD from '93 were patent at that time), HTN, hypothyroidism, CVA, OA, HLD, cataract extraction '12, left TKA '06.  PCP is with [facility]. Nephrologist is Dr. Caryn Section.  His Cardiologist is [name], last visit 04/21/12 (records just received this afternoon).  By notes, he was not having any significant CV symptoms, but was appeared "somewhat addled" but A&O that day.  [name] clarified his medications and recommended six month follow-up.  Echo on 10/10/11 showed: - Left ventricle: The cavity size was normal. There was mild focal basal hypertrophy of the septum. Systolic function was mildly to moderately reduced. The estimated ejection fraction was in the range of 40% to 45%. There is dyskinesis of the basal-midinferior myocardium. Doppler parameters are consistent with abnormal left ventricular relaxation (grade 1 diastolic dysfunction). Doppler parameters are consistent with high ventricular filling pressure. - Aortic valve: Cusp separation was moderately reduced. Transvalvular velocity was within the normal range. May consider further evaluation to try to obtain improved Doppler signal. Mild regurgitation. - Mitral valve: Calcified annulus. Mild regurgitation directed eccentrically and posteriorly. - Tricuspid valve: Mild regurgitation.  Dr. York Spaniel note indicates his last adenosine cardiolite was in 12/17/04, EF 55%.  EKG on 10/09/11 showed SR with bigeminy PACs, non-specific intraventricular conduction delay, probable old inferior infarct.  It was not felt significantly changed since 02/22/09.    His last cardiac cath was on 03/05/04 prior to his redo CABG.  He is for a  CXR and labs on the day of surgery.    Patient is nearing need for hemodialysis.  He has seen [name] within the last five months and was not having any significant CV symptoms at that time. His EKG appears stable.  His same day results will be reviewed and the patient evaluated on the day of surgery by his assigned anesthesiologist to determine a definitive anesthesia plan.  Velna Ochs [facility]/Anesthesiology Phone [phone] 08/18/2012 3:23 PM

## 2012-08-20 MED ORDER — DEXTROSE 5 % IV SOLN
1.5000 g | INTRAVENOUS | Status: DC
  Filled 2012-08-20: qty 1.5

## 2012-08-20 MED ORDER — SODIUM CHLORIDE 0.9 % IV SOLN
INTRAVENOUS | Status: DC
  Administered 2012-08-21: 10:00:00 via INTRAVENOUS

## 2012-08-21 ENCOUNTER — Encounter (HOSPITAL_COMMUNITY)
Admission: RE | Disposition: A | Discharge status: Home / Self Care | Payer: Self-pay | Source: Ambulatory Visit | Attending: Vascular Surgery

## 2012-08-21 ENCOUNTER — Encounter (HOSPITAL_COMMUNITY): Payer: Self-pay | Admitting: Vascular Surgery

## 2012-08-21 ENCOUNTER — Ambulatory Visit (HOSPITAL_COMMUNITY): Payer: Medicare Other | Admitting: Vascular Surgery

## 2012-08-21 ENCOUNTER — Ambulatory Visit (HOSPITAL_COMMUNITY)
Admission: RE | Admit: 2012-08-21 | Discharge: 2012-08-21 | Disposition: A | Discharge status: Home / Self Care | Payer: Medicare Other | Source: Ambulatory Visit | Attending: Vascular Surgery | Admitting: Vascular Surgery

## 2012-08-21 ENCOUNTER — Ambulatory Visit (HOSPITAL_COMMUNITY): Payer: Medicare Other

## 2012-08-21 ENCOUNTER — Encounter (HOSPITAL_COMMUNITY): Payer: Self-pay | Admitting: *Deleted

## 2012-08-21 DIAGNOSIS — E785 Hyperlipidemia, unspecified: Secondary | ICD-10-CM | POA: Insufficient documentation

## 2012-08-21 DIAGNOSIS — I252 Old myocardial infarction: Secondary | ICD-10-CM | POA: Insufficient documentation

## 2012-08-21 DIAGNOSIS — N184 Chronic kidney disease, stage 4 (severe): Secondary | ICD-10-CM | POA: Insufficient documentation

## 2012-08-21 DIAGNOSIS — N186 End stage renal disease: Secondary | ICD-10-CM

## 2012-08-21 DIAGNOSIS — E039 Hypothyroidism, unspecified: Secondary | ICD-10-CM | POA: Insufficient documentation

## 2012-08-21 DIAGNOSIS — I129 Hypertensive chronic kidney disease with stage 1 through stage 4 chronic kidney disease, or unspecified chronic kidney disease: Secondary | ICD-10-CM | POA: Insufficient documentation

## 2012-08-21 DIAGNOSIS — M1990 Unspecified osteoarthritis, unspecified site: Secondary | ICD-10-CM | POA: Insufficient documentation

## 2012-08-21 HISTORY — DX: Gastro-esophageal reflux disease without esophagitis: K21.9

## 2012-08-21 HISTORY — DX: Shortness of breath: R06.02

## 2012-08-21 HISTORY — DX: Depression, unspecified: F32.A

## 2012-08-21 HISTORY — DX: Personal history of other medical treatment: Z92.89

## 2012-08-21 HISTORY — PX: AV FISTULA PLACEMENT: SHX1204

## 2012-08-21 HISTORY — DX: Major depressive disorder, single episode, unspecified: F32.9

## 2012-08-21 LAB — POCT I-STAT 4, (NA,K, GLUC, HGB,HCT): Hemoglobin: 13.3 g/dL (ref 13.0–17.0)

## 2012-08-21 SURGERY — ARTERIOVENOUS (AV) FISTULA CREATION
Anesthesia: Monitor Anesthesia Care | Site: Arm Upper | Laterality: Right | Wound class: Clean

## 2012-08-21 MED ORDER — MEPERIDINE HCL 25 MG/ML IJ SOLN: 6.2500 mg | INTRAMUSCULAR | Status: DC | PRN

## 2012-08-21 MED ORDER — MIDAZOLAM HCL 2 MG/2ML IJ SOLN: 0.5000 mg | Freq: Once | INTRAMUSCULAR | Status: DC | PRN

## 2012-08-21 MED ORDER — OXYCODONE HCL 5 MG PO TABS: 5.0000 mg | ORAL_TABLET | Freq: Four times a day (QID) | ORAL | Status: DC | PRN

## 2012-08-21 MED ORDER — ONDANSETRON HCL 4 MG/2ML IJ SOLN
INTRAMUSCULAR | Status: DC | PRN
  Administered 2012-08-21: 4 mg via INTRAVENOUS

## 2012-08-21 MED ORDER — DEXTROSE 5 % IV SOLN
1.5000 g | INTRAVENOUS | Status: DC | PRN
  Administered 2012-08-21: 1.5 g via INTRAVENOUS

## 2012-08-21 MED ORDER — FENTANYL CITRATE 0.05 MG/ML IJ SOLN: 25.0000 ug | INTRAMUSCULAR | Status: DC | PRN

## 2012-08-21 MED ORDER — SODIUM CHLORIDE 0.9 % IV SOLN
INTRAVENOUS | Status: DC | PRN
  Administered 2012-08-21: 09:00:00 via INTRAVENOUS

## 2012-08-21 MED ORDER — OXYCODONE HCL 5 MG PO TABS
5.0000 mg | ORAL_TABLET | Freq: Once | ORAL | Status: DC | PRN
Start: 2012-08-21 — End: 2012-08-21

## 2012-08-21 MED ORDER — PROPOFOL INFUSION 10 MG/ML OPTIME
INTRAVENOUS | Status: DC | PRN
  Administered 2012-08-21: 75 ug/kg/min via INTRAVENOUS

## 2012-08-21 MED ORDER — OXYCODONE HCL 5 MG/5ML PO SOLN: 5.0000 mg | Freq: Once | ORAL | Status: DC | PRN

## 2012-08-21 MED ORDER — LIDOCAINE HCL (CARDIAC) 20 MG/ML IV SOLN
INTRAVENOUS | Status: DC | PRN
  Administered 2012-08-21: 50 mg via INTRAVENOUS

## 2012-08-21 MED ORDER — PROMETHAZINE HCL 25 MG/ML IJ SOLN
6.2500 mg | INTRAMUSCULAR | Status: DC | PRN
  Administered 2012-08-21: 6.25 mg via INTRAVENOUS

## 2012-08-21 MED ORDER — 0.9 % SODIUM CHLORIDE (POUR BTL) OPTIME
TOPICAL | Status: DC | PRN
  Administered 2012-08-21: 1000 mL

## 2012-08-21 MED ORDER — LIDOCAINE-EPINEPHRINE 0.5 %-1:200000 IJ SOLN
INTRAMUSCULAR | Status: AC
  Filled 2012-08-21: qty 1

## 2012-08-21 MED ORDER — PHENYLEPHRINE HCL 10 MG/ML IJ SOLN
INTRAMUSCULAR | Status: DC | PRN
  Administered 2012-08-21: 40 ug via INTRAVENOUS

## 2012-08-21 MED ORDER — SODIUM CHLORIDE 0.9 % IR SOLN
Status: DC | PRN
  Administered 2012-08-21: 11:00:00

## 2012-08-21 MED ORDER — EPHEDRINE SULFATE 50 MG/ML IJ SOLN
INTRAMUSCULAR | Status: DC | PRN
  Administered 2012-08-21: 5 mg via INTRAVENOUS
  Administered 2012-08-21: 10 mg via INTRAVENOUS
  Administered 2012-08-21 (×2): 5 mg via INTRAVENOUS

## 2012-08-21 MED ORDER — MUPIROCIN 2 % EX OINT
TOPICAL_OINTMENT | Freq: Two times a day (BID) | CUTANEOUS | Status: DC
  Administered 2012-08-21: 09:00:00 via NASAL
  Filled 2012-08-21: qty 22

## 2012-08-21 MED ORDER — PROMETHAZINE HCL 25 MG/ML IJ SOLN
INTRAMUSCULAR | Status: AC
  Filled 2012-08-21: qty 1

## 2012-08-21 MED ORDER — FENTANYL CITRATE 0.05 MG/ML IJ SOLN
INTRAMUSCULAR | Status: DC | PRN
  Administered 2012-08-21 (×2): 50 ug via INTRAVENOUS

## 2012-08-21 MED ORDER — MUPIROCIN 2 % EX OINT
TOPICAL_OINTMENT | CUTANEOUS | Status: AC
  Filled 2012-08-21: qty 22

## 2012-08-21 MED ORDER — LIDOCAINE-EPINEPHRINE 0.5 %-1:200000 IJ SOLN
INTRAMUSCULAR | Status: DC | PRN
  Administered 2012-08-21: 2.5 mL

## 2012-08-21 SURGICAL SUPPLY — 36 items
BENZOIN TINCTURE PRP APPL 2/3 (GAUZE/BANDAGES/DRESSINGS) ×2 IMPLANT
CANISTER SUCTION 2500CC (MISCELLANEOUS) ×2 IMPLANT
CLIP LIGATING EXTRA MED SLVR (CLIP) ×2 IMPLANT
CLIP LIGATING EXTRA SM BLUE (MISCELLANEOUS) ×2 IMPLANT
CLOTH BEACON ORANGE TIMEOUT ST (SAFETY) ×2 IMPLANT
CLSR STERI-STRIP ANTIMIC 1/2X4 (GAUZE/BANDAGES/DRESSINGS) ×2 IMPLANT
COVER PROBE W GEL 5X96 (DRAPES) IMPLANT
COVER SURGICAL LIGHT HANDLE (MISCELLANEOUS) ×2 IMPLANT
DECANTER SPIKE VIAL GLASS SM (MISCELLANEOUS) IMPLANT
ELECT REM PT RETURN 9FT ADLT (ELECTROSURGICAL) ×2
ELECTRODE REM PT RTRN 9FT ADLT (ELECTROSURGICAL) ×1 IMPLANT
GEL ULTRASOUND 20GR AQUASONIC (MISCELLANEOUS) IMPLANT
GLOVE BIO SURGEON STRL SZ 6.5 (GLOVE) ×4 IMPLANT
GLOVE BIOGEL PI IND STRL 6.5 (GLOVE) ×2 IMPLANT
GLOVE BIOGEL PI IND STRL 8 (GLOVE) ×1 IMPLANT
GLOVE BIOGEL PI INDICATOR 6.5 (GLOVE) ×2
GLOVE BIOGEL PI INDICATOR 8 (GLOVE) ×1
GLOVE SS BIOGEL STRL SZ 7.5 (GLOVE) ×1 IMPLANT
GLOVE SUPERSENSE BIOGEL SZ 7.5 (GLOVE) ×1
GLOVE SURG SS PI 6.5 STRL IVOR (GLOVE) ×2 IMPLANT
GOWN STRL NON-REIN LRG LVL3 (GOWN DISPOSABLE) ×6 IMPLANT
KIT BASIN OR (CUSTOM PROCEDURE TRAY) ×2 IMPLANT
KIT ROOM TURNOVER OR (KITS) ×2 IMPLANT
NS IRRIG 1000ML POUR BTL (IV SOLUTION) ×2 IMPLANT
PACK CV ACCESS (CUSTOM PROCEDURE TRAY) ×2 IMPLANT
PAD ARMBOARD 7.5X6 YLW CONV (MISCELLANEOUS) ×4 IMPLANT
SPONGE GAUZE 4X4 12PLY (GAUZE/BANDAGES/DRESSINGS) ×2 IMPLANT
STRIP CLOSURE SKIN 1/2X4 (GAUZE/BANDAGES/DRESSINGS) ×2 IMPLANT
SUT PROLENE 6 0 CC (SUTURE) ×2 IMPLANT
SUT VIC AB 3-0 SH 27 (SUTURE) ×1
SUT VIC AB 3-0 SH 27X BRD (SUTURE) ×1 IMPLANT
TAPE CLOTH SURG 4X10 WHT LF (GAUZE/BANDAGES/DRESSINGS) ×2 IMPLANT
TOWEL OR 17X24 6PK STRL BLUE (TOWEL DISPOSABLE) ×2 IMPLANT
TOWEL OR 17X26 10 PK STRL BLUE (TOWEL DISPOSABLE) ×2 IMPLANT
UNDERPAD 30X30 INCONTINENT (UNDERPADS AND DIAPERS) ×2 IMPLANT
WATER STERILE IRR 1000ML POUR (IV SOLUTION) ×2 IMPLANT

## 2012-08-21 NOTE — Transfer of Care (Signed)
Immediate Anesthesia Transfer of Care Note  Patient: [name]  Procedure(s) Performed: Procedure(s): ARTERIOVENOUS (AV) FISTULA CREATION (Right)  Patient Location: PACU  Anesthesia Type:MAC  Level of Consciousness: awake  Airway & Oxygen Therapy: Patient Spontanous Breathing and Patient connected to face mask oxygen  Post-op Assessment: Report given to PACU RN and Post -op Vital signs reviewed and stable  Post vital signs: Reviewed and stable  Complications: No apparent anesthesia complications

## 2012-08-21 NOTE — Anesthesia Preprocedure Evaluation (Signed)
Anesthesia Evaluation  Patient identified by MRN, date of birth, ID band Patient awake and Patient confused    Reviewed: Allergy & Precautions, H&P , NPO status , Patient's Chart, lab work & pertinent test results  History of Anesthesia Complications Negative for: history of anesthetic complications  Airway Mallampati: II TM Distance: >3 FB Neck ROM: Full    Dental  (+) Poor Dentition, Missing, Dental Advisory Given and Chipped   Pulmonary shortness of breath, former smoker,  breath sounds clear to auscultation  Pulmonary exam normal       Cardiovascular hypertension, Pt. on medications + CAD, + Past MI, + Cardiac Stents and + CABG (CABG and re-do CABG) Rhythm:Regular Rate:Normal  '13 ECHO: EF 45-50%, mild MR   Neuro/Psych TIA   GI/Hepatic Neg liver ROS, GERD-  Controlled,  Endo/Other  Hypothyroidism   Renal/GU ESRFRenal disease (K+ 4.0, no dialysis yet)     Musculoskeletal   Abdominal   Peds  Hematology   Anesthesia Other Findings   Reproductive/Obstetrics                           Anesthesia Physical Anesthesia Plan  ASA: III  Anesthesia Plan: MAC   Post-op Pain Management:    Induction: Intravenous  Airway Management Planned: Natural Airway and Simple Face Mask  Additional Equipment:   Intra-op Plan:   Post-operative Plan:   Informed Consent: I have reviewed the patients History and Physical, chart, labs and discussed the procedure including the risks, benefits and alternatives for the proposed anesthesia with the patient or authorized representative who has indicated his/her understanding and acceptance.   Dental advisory given  Plan Discussed with: Surgeon and CRNA  Anesthesia Plan Comments: (Plan routine monitors, MAC)        Anesthesia Quick Evaluation

## 2012-08-21 NOTE — Anesthesia Postprocedure Evaluation (Signed)
  Anesthesia Post-op Note  Patient: [name]  Procedure(s) Performed: Procedure(s): ARTERIOVENOUS (AV) FISTULA CREATION (Right)  Patient Location: PACU  Anesthesia Type:MAC  Level of Consciousness: awake, alert  and patient cooperative  Airway and Oxygen Therapy: Patient Spontanous Breathing  Post-op Pain: none  Post-op Assessment: Post-op Vital signs reviewed, Patient's Cardiovascular Status Stable, Respiratory Function Stable, Patent Airway, No signs of Nausea or vomiting and Pain level controlled  Post-op Vital Signs: Reviewed and stable  Complications: No apparent anesthesia complications

## 2012-08-21 NOTE — Op Note (Signed)
OPERATIVE REPORT  DATE OF SURGERY: 08/21/2012  PATIENT: [name], 79 y.o. male MRN: [id number]  DOB: 1920-08-07  PRE-OPERATIVE DIAGNOSIS: Chronic renal insufficiency  POST-OPERATIVE DIAGNOSIS:  Same  PROCEDURE: Right upper arm AV fistula  SURGEON:  Gretta Began, M.D.  PHYSICIAN ASSISTANT: Rhyne  ANESTHESIA:  Local with sedation  EBL: Minimal ml     BLOOD ADMINISTERED: None  DRAINS: None  SPECIMEN: None  COUNTS CORRECT:  YES  PLAN OF CARE: PACU   PATIENT DISPOSITION:  PACU - hemodynamically stable  PROCEDURE DETAILS: The patient was taken to the operating room placed supine position where the area the right arm was prepped and draped in usual sterile fashion. Using local anesthesia incision is made over the antecubital space carried down to isolate the cephalic vein which was of good caliber and the brachial artery which was also of good caliber. Tributary edges were ligated on the cephalic vein. The vein was ligated distally and divided and mobilized to the level of the brachial artery. The brachial artery was occluded proximally and distally and opened with an 11 blade and extended longitudinally with Potts scissors. The vein was spatulated and sewn end-to-side to the artery with a running 6-0 Prolene suture. Clamps removed and excellent thrill was noted. Wound irrigated with saline. Hemostasis was obtained with electrocautery. Wounds were closed with 3-0 Vicryl in the subcutaneous and subcuticular tissue. Benzoin and Steri-Strips were applied. The patient was taken to the recovery room in stable condition.   Gretta Began, M.D. 08/21/2012 11:15 AM

## 2012-08-21 NOTE — H&P (View-Only) (Signed)
[facility] [place]   Patient name: [name] MRN: [id number] DOB: 08/19/1920 Sex: male   Referred by: Fox  Reason for referral:  Chief Complaint  Patient presents with  . New Evaluation    consult for placement of AVF     HISTORY OF PRESENT ILLNESS: The patient is a for discussion of AV access. He is here today with his son. He has progressive chronic renal insufficiency and is approaching need for hemodialysis. They have view the teaching sessions regarding AV access for hemodialysis. I again reviewed options to include hemodialysis catheter, AV graft and AV fistula. He is in failing health and has multiple questions as to whether or hemodialysis will improve his memory, stamina and stamering with his speech. I explained that a hemodialysis and really had no impact on all these issues.  Past Medical History  Diagnosis Date  . MI (myocardial infarction)   . Stroke   . Hypertension   . CAD (coronary artery disease)     Prior CABG x 2 last in 2005    . Hypertensive heart disease without CHF   . Chronic kidney disease (CKD), stage IV (severe)   . Hyperlipidemia   . Hypothyroidism   . History of TIA (transient ischemic attack)   . Lumbar disc disease   . Osteoarthritis   . Hypothyroidism     Past Surgical History  Procedure Laterality Date  . Coronary artery bypass graft  2005  . Coronary stent placement    . Coronary artery bypass graft    . Cardiac catheterization      stents  . Hernia repair    . Joint replacement Left 04/2005  . Kidney cyst removal Right 08/2008  . Transurethral resection of prostate    . Cataract surgery Left 2012    History   Social History  . Marital Status: Married    Spouse Name: N/A    Number of Children: N/A  . Years of Education: N/A   Occupational History  . Not on file.   Social History Main Topics  . Smoking status: Former Smoker  . Smokeless tobacco: Never Used  . Alcohol Use: No  . Drug Use: No   . Sexually Active: Yes   Other Topics Concern  . Not on file   Social History Narrative  . No narrative on file    History reviewed. No pertinent family history.  Allergies as of 08/15/2012 - Review Complete 08/15/2012  Allergen Reaction Noted  . Codeine  10/09/2011    Current Outpatient Prescriptions on File Prior to Visit  Medication Sig Dispense Refill  . acetaminophen (TYLENOL) 500 MG tablet Take 1,000 mg by mouth every 6 (six) hours as needed. For pain      . amLODipine (NORVASC) 5 MG tablet Take by mouth daily.      . Ascorbic Acid (VITAMIN C) 1000 MG tablet Take 1,000 mg by mouth daily.      . aspirin EC 81 MG tablet Take 81 mg by mouth daily.      . Calcium Carbonate-Vitamin D (CALCIUM + D PO) Take 1,200 mg by mouth daily.      . cholecalciferol (VITAMIN D) 1000 UNITS tablet Take 1,000 Units by mouth daily.      . fish oil-omega-3 fatty acids 1000 MG capsule Take 1 g by mouth daily.      . levothyroxine (SYNTHROID, LEVOTHROID) 112 MCG tablet Take 100 mcg by mouth daily.       .   Multiple Vitamin (MULITIVITAMIN WITH MINERALS) TABS Take 1 tablet by mouth daily.      . nitroGLYCERIN (NITROSTAT) 0.4 MG SL tablet Place 1 tablet (0.4 mg total) under the tongue every 5 (five) minutes x 3 doses as needed for chest pain.      . omeprazole (PRILOSEC) 20 MG capsule Take 20 mg by mouth daily.      . pravastatin (PRAVACHOL) 80 MG tablet Take 80 mg by mouth daily.      . sertraline (ZOLOFT) 100 MG tablet Take 50 mg by mouth daily.      . triamterene-hydrochlorothiazide (MAXZIDE) 75-50 MG per tablet Take 0.5 tablets by mouth daily.      . vitamin B-12 (CYANOCOBALAMIN) 1000 MCG tablet Take 1,000 mcg by mouth daily.      . ferrous sulfate 325 (65 FE) MG tablet Take 325 mg by mouth daily with breakfast.      . fexofenadine (ALLEGRA) 180 MG tablet Take 180 mg by mouth daily.      . vitamin E 400 UNIT capsule Take 400 Units by mouth daily.       No current facility-administered medications  on file prior to visit.     REVIEW OF SYSTEMS:  Positives indicated with an "X"  CARDIOVASCULAR:  [ ] chest pain   [ ] chest pressure   [ ] palpitations   [ ] orthopnea   [ ] dyspnea on exertion   [ ] claudication   [ ] rest pain   [ ] DVT   [ ] phlebitis PULMONARY:   [ ] productive cough   [ ] asthma   [ ] wheezing NEUROLOGIC:   [ ] weakness  [ ] paresthesias  [ ] aphasia  [ ] amaurosis  [ ] dizziness HEMATOLOGIC:   [ ] bleeding problems   [ ] clotting disorders MUSCULOSKELETAL:  [ ] joint pain   [ ] joint swelling GASTROINTESTINAL: [ ]  blood in stool  [ ]  hematemesis GENITOURINARY:  [ ]  dysuria  [ ]  hematuria PSYCHIATRIC:  [ ] history of major depression INTEGUMENTARY:  [ ] rashes  [ ] ulcers CONSTITUTIONAL:  [ ] fever   [ ] chills  PHYSICAL EXAMINATION:  General: The patient is a well-nourished male, in no acute distress. Vital signs are BP 189/101  Pulse 66  Resp 18  Ht 5' 10" (1.778 m)  Wt 155 lb 6.4 oz (70.489 kg)  BMI 22.3 kg/m2 Pulmonary: There is a good air exchange bilaterally  Abdomen: Soft and non-tender  Musculoskeletal: There are no major deformities.  There is no significant extremity pain. Neurologic: No focal weakness or paresthesias are detected, Skin: There are no ulcer or rashes noted. Psychiatric: The patient has normal affect. Pulse status: 2+ radial pulses bilaterally. Moderate size cephalic vein at the antecubital space but otherwise small veins.   VVS Vascular Lab Studies:  Ordered and Independently Reviewed vein map revealed 2 mm in greater cephalic vein on the right and small cephalic vein on the left. The basilic vein was small bilaterally. I reimaged these areas myself with SonoSite he does appear to have adequate cephalic vein at the right antecubital space for AV fistula  Impression and Plan:  Arrest of chronic renal insufficiency in need for hemodialysis access. I have recommended right upper AV fistula creation. We have scheduled this  for outpatient surgery on 08/21/12 at the hospital.    Crewe Heathman [facility] [place] Office: [phone]         

## 2012-08-21 NOTE — Interval H&P Note (Signed)
History and Physical Interval Note:  08/21/2012 7:24 AM  [name]  has presented today for surgery, with the diagnosis of ESRD  The various methods of treatment have been discussed with the patient and family. After consideration of risks, benefits and other options for treatment, the patient has consented to  Procedure(s): ARTERIOVENOUS (AV) FISTULA CREATION (Right) as a surgical intervention .  The patient's history has been reviewed, patient examined, no change in status, stable for surgery.  I have reviewed the patient's chart and labs.  Questions were answered to the patient's satisfaction.     [name]

## 2012-08-22 ENCOUNTER — Encounter (HOSPITAL_COMMUNITY): Payer: Self-pay | Admitting: Vascular Surgery

## 2012-08-22 ENCOUNTER — Telehealth: Payer: Self-pay | Admitting: Vascular Surgery

## 2012-08-22 ENCOUNTER — Other Ambulatory Visit: Payer: Self-pay | Admitting: *Deleted

## 2012-08-22 DIAGNOSIS — Z4931 Encounter for adequacy testing for hemodialysis: Secondary | ICD-10-CM

## 2012-08-22 DIAGNOSIS — N186 End stage renal disease: Secondary | ICD-10-CM

## 2012-08-22 NOTE — Telephone Encounter (Addendum)
Message copied by Rosalyn Charters on Tue Aug 22, 2012  1:16 PM ------      Message from: Sharee Pimple      Created: Tue Aug 22, 2012  8:23 AM      Regarding: schedule                   ----- Message -----         From: Dara Lords, PA-C         Sent: 08/21/2012  11:13 AM           To: Sharee Pimple, CMA            S/p right brachiocephalic AVF 08/21/12 by Dr. Arbie Cookey.  F/u with him in 4 weeks.            Thanks,      Lelon Mast ------  NOTIFIED PATIENT OF FU WITH DR. EARLY MAY 13TH AT 3:45

## 2012-09-18 ENCOUNTER — Encounter: Payer: Self-pay | Admitting: Vascular Surgery

## 2012-09-19 ENCOUNTER — Encounter: Payer: Self-pay | Admitting: Vascular Surgery

## 2012-09-19 ENCOUNTER — Ambulatory Visit (INDEPENDENT_AMBULATORY_CARE_PROVIDER_SITE_OTHER): Payer: Medicare Other | Admitting: Vascular Surgery

## 2012-09-19 VITALS — BP 157/81 | HR 81 | Resp 16 | Ht 69.0 in | Wt 155.0 lb

## 2012-09-19 DIAGNOSIS — N186 End stage renal disease: Secondary | ICD-10-CM

## 2012-09-19 NOTE — Progress Notes (Addendum)
[facility] [place]  Subjective  - No new complaints.  His right arm is well healed.  No signs of steal syndrome.   HPI: 4 weeks s/p right upper arm av fistula.  Objective 157/81 81   16    PE: Right AV fistula palpable thrill. Incision is well healed Sensation intact and equal bilateral.    Assessment/Planning: POD # 4 weeks  Right upper arm AV fistula Follow up PRN The fistula will be ready for use at 12 weeks post-op.  [name] [facility] 09/19/2012 4:06 PM --  Laboratory Lab Results: No results found for this basename: WBC, HGB, HCT, PLT,  in the last 72 hours BMET No results found for this basename: NA, K, CL, CO2, GLUCOSE, BUN, CREATININE, CALCIUM,  in the last 72 hours  COAG No results found for this basename: INR, PROTIME   No results found for this basename: PTT      I have examined the patient, reviewed and agree with above. Good early result from upper arm AV fistula creation. He will continue his usual activity and hopefully will have adequate fistula maturation if he comes to hemodialysis. We will be available as needed EARLY, TODD, MD 09/19/2012 4:45 PM

## 2012-12-13 ENCOUNTER — Other Ambulatory Visit: Payer: Self-pay

## 2013-03-15 ENCOUNTER — Other Ambulatory Visit: Payer: Self-pay

## 2013-05-11 ENCOUNTER — Emergency Department (HOSPITAL_COMMUNITY): Payer: Medicare Other

## 2013-05-11 ENCOUNTER — Inpatient Hospital Stay (HOSPITAL_COMMUNITY)
Admission: EM | Admit: 2013-05-11 | Discharge: 2013-05-19 | DRG: 682 | Disposition: A | Discharge status: Skilled Nursing Facility | Payer: Medicare Other | Attending: Internal Medicine | Admitting: Internal Medicine

## 2013-05-11 ENCOUNTER — Encounter (HOSPITAL_COMMUNITY): Payer: Self-pay | Admitting: Emergency Medicine

## 2013-05-11 DIAGNOSIS — J810 Acute pulmonary edema: Secondary | ICD-10-CM

## 2013-05-11 DIAGNOSIS — Z889 Allergy status to unspecified drugs, medicaments and biological substances status: Secondary | ICD-10-CM

## 2013-05-11 DIAGNOSIS — F329 Major depressive disorder, single episode, unspecified: Secondary | ICD-10-CM | POA: Diagnosis present

## 2013-05-11 DIAGNOSIS — N039 Chronic nephritic syndrome with unspecified morphologic changes: Secondary | ICD-10-CM

## 2013-05-11 DIAGNOSIS — Z8673 Personal history of transient ischemic attack (TIA), and cerebral infarction without residual deficits: Secondary | ICD-10-CM

## 2013-05-11 DIAGNOSIS — N186 End stage renal disease: Secondary | ICD-10-CM

## 2013-05-11 DIAGNOSIS — E872 Acidosis, unspecified: Secondary | ICD-10-CM

## 2013-05-11 DIAGNOSIS — N179 Acute kidney failure, unspecified: Secondary | ICD-10-CM | POA: Diagnosis present

## 2013-05-11 DIAGNOSIS — Z87891 Personal history of nicotine dependence: Secondary | ICD-10-CM

## 2013-05-11 DIAGNOSIS — D631 Anemia in chronic kidney disease: Secondary | ICD-10-CM | POA: Diagnosis present

## 2013-05-11 DIAGNOSIS — Z9861 Coronary angioplasty status: Secondary | ICD-10-CM

## 2013-05-11 DIAGNOSIS — E785 Hyperlipidemia, unspecified: Secondary | ICD-10-CM

## 2013-05-11 DIAGNOSIS — M1990 Unspecified osteoarthritis, unspecified site: Secondary | ICD-10-CM

## 2013-05-11 DIAGNOSIS — M519 Unspecified thoracic, thoracolumbar and lumbosacral intervertebral disc disorder: Secondary | ICD-10-CM

## 2013-05-11 DIAGNOSIS — I2510 Atherosclerotic heart disease of native coronary artery without angina pectoris: Secondary | ICD-10-CM

## 2013-05-11 DIAGNOSIS — N2581 Secondary hyperparathyroidism of renal origin: Secondary | ICD-10-CM

## 2013-05-11 DIAGNOSIS — Z951 Presence of aortocoronary bypass graft: Secondary | ICD-10-CM

## 2013-05-11 DIAGNOSIS — I5043 Acute on chronic combined systolic (congestive) and diastolic (congestive) heart failure: Secondary | ICD-10-CM

## 2013-05-11 DIAGNOSIS — Z79899 Other long term (current) drug therapy: Secondary | ICD-10-CM

## 2013-05-11 DIAGNOSIS — N19 Unspecified kidney failure: Secondary | ICD-10-CM | POA: Diagnosis present

## 2013-05-11 DIAGNOSIS — E039 Hypothyroidism, unspecified: Secondary | ICD-10-CM

## 2013-05-11 DIAGNOSIS — I119 Hypertensive heart disease without heart failure: Secondary | ICD-10-CM

## 2013-05-11 DIAGNOSIS — J9600 Acute respiratory failure, unspecified whether with hypoxia or hypercapnia: Secondary | ICD-10-CM | POA: Diagnosis not present

## 2013-05-11 DIAGNOSIS — K219 Gastro-esophageal reflux disease without esophagitis: Secondary | ICD-10-CM | POA: Diagnosis present

## 2013-05-11 DIAGNOSIS — N184 Chronic kidney disease, stage 4 (severe): Secondary | ICD-10-CM

## 2013-05-11 DIAGNOSIS — I70269 Atherosclerosis of native arteries of extremities with gangrene, unspecified extremity: Secondary | ICD-10-CM

## 2013-05-11 DIAGNOSIS — R0603 Acute respiratory distress: Secondary | ICD-10-CM | POA: Diagnosis present

## 2013-05-11 DIAGNOSIS — Z992 Dependence on renal dialysis: Secondary | ICD-10-CM

## 2013-05-11 DIAGNOSIS — F0390 Unspecified dementia without behavioral disturbance: Secondary | ICD-10-CM | POA: Diagnosis present

## 2013-05-11 DIAGNOSIS — F3289 Other specified depressive episodes: Secondary | ICD-10-CM | POA: Diagnosis present

## 2013-05-11 DIAGNOSIS — D638 Anemia in other chronic diseases classified elsewhere: Secondary | ICD-10-CM

## 2013-05-11 DIAGNOSIS — Z7982 Long term (current) use of aspirin: Secondary | ICD-10-CM

## 2013-05-11 DIAGNOSIS — I120 Hypertensive chronic kidney disease with stage 5 chronic kidney disease or end stage renal disease: Principal | ICD-10-CM | POA: Diagnosis present

## 2013-05-11 DIAGNOSIS — G9341 Metabolic encephalopathy: Secondary | ICD-10-CM

## 2013-05-11 DIAGNOSIS — I252 Old myocardial infarction: Secondary | ICD-10-CM

## 2013-05-11 DIAGNOSIS — R9389 Abnormal findings on diagnostic imaging of other specified body structures: Secondary | ICD-10-CM

## 2013-05-11 DIAGNOSIS — I509 Heart failure, unspecified: Secondary | ICD-10-CM | POA: Diagnosis present

## 2013-05-11 DIAGNOSIS — D509 Iron deficiency anemia, unspecified: Secondary | ICD-10-CM | POA: Diagnosis present

## 2013-05-11 DIAGNOSIS — R410 Disorientation, unspecified: Secondary | ICD-10-CM | POA: Diagnosis present

## 2013-05-11 DIAGNOSIS — I169 Hypertensive crisis, unspecified: Secondary | ICD-10-CM | POA: Diagnosis present

## 2013-05-11 LAB — CBC WITH DIFFERENTIAL/PLATELET
Basophils Absolute: 0 10*3/uL (ref 0.0–0.1)
Basophils Relative: 0 % (ref 0–1)
Eosinophils Absolute: 0.3 10*3/uL (ref 0.0–0.7)
Eosinophils Relative: 3 % (ref 0–5)
HEMATOCRIT: 34.5 % — AB (ref 39.0–52.0)
HEMOGLOBIN: 11.7 g/dL — AB (ref 13.0–17.0)
LYMPHS PCT: 15 % (ref 12–46)
Lymphs Abs: 1.4 10*3/uL (ref 0.7–4.0)
MCH: 31.9 pg (ref 26.0–34.0)
MCHC: 33.9 g/dL (ref 30.0–36.0)
MCV: 94 fL (ref 78.0–100.0)
MONO ABS: 0.8 10*3/uL (ref 0.1–1.0)
MONOS PCT: 8 % (ref 3–12)
NEUTROS ABS: 7 10*3/uL (ref 1.7–7.7)
NEUTROS PCT: 74 % (ref 43–77)
Platelets: 224 10*3/uL (ref 150–400)
RBC: 3.67 MIL/uL — AB (ref 4.22–5.81)
RDW: 14.2 % (ref 11.5–15.5)
WBC: 9.5 10*3/uL (ref 4.0–10.5)

## 2013-05-11 LAB — COMPREHENSIVE METABOLIC PANEL
ALBUMIN: 3.5 g/dL (ref 3.5–5.2)
ALK PHOS: 88 U/L (ref 39–117)
ALT: 23 U/L (ref 0–53)
AST: 28 U/L (ref 0–37)
BILIRUBIN TOTAL: 0.4 mg/dL (ref 0.3–1.2)
BUN: 77 mg/dL — AB (ref 6–23)
CHLORIDE: 104 meq/L (ref 96–112)
CO2: 16 meq/L — AB (ref 19–32)
CREATININE: 3.63 mg/dL — AB (ref 0.50–1.35)
Calcium: 9.1 mg/dL (ref 8.4–10.5)
GFR calc Af Amer: 16 mL/min — ABNORMAL LOW (ref >90)
GFR, EST NON AFRICAN AMERICAN: 13 mL/min — AB (ref >90)
Glucose, Bld: 150 mg/dL — ABNORMAL HIGH (ref 70–99)
POTASSIUM: 4.6 meq/L (ref 3.7–5.3)
Sodium: 137 mEq/L (ref 137–147)
Total Protein: 7.7 g/dL (ref 6.0–8.3)

## 2013-05-11 LAB — POCT I-STAT TROPONIN I: Troponin i, poc: 0.06 ng/mL (ref 0.00–0.08)

## 2013-05-11 MED ORDER — SODIUM CHLORIDE 0.9 % IV SOLN
INTRAVENOUS | Status: DC
  Administered 2013-05-11: 21:00:00 via INTRAVENOUS

## 2013-05-11 NOTE — ED Notes (Addendum)
Patient transported to CT 

## 2013-05-11 NOTE — ED Notes (Signed)
Bladder scan performed  0ml noted

## 2013-05-11 NOTE — ED Provider Notes (Signed)
CSN: [id number]     Arrival date & time 05/11/13  1833 History   First MD Initiated Contact with Patient 05/11/13 1940     Chief Complaint  Patient presents with  . Fatigue    HPI Pt is brought into the Emergency Department by family. Pt was sleeping a lot yesterday, more than normal.  Family states his wife was asking him how he was doing and he was not responding as quickly as normal.  Pt does not recall that.  A slight cough and some congestion in the last day or so but not very severe.    No vomiting or diarrhea.  No change in urine.  Pt told family earlier that he had a fever however he does not know the number and family is not sure if he is confused about that.    No CP, abd pain or headache.    Past Medical History  Diagnosis Date  . Stroke   . CAD (coronary artery disease)     Prior CABG x 2 last in 2005    . Hypertensive heart disease without CHF   . Hyperlipidemia     not on any medications  . History of TIA (transient ischemic attack)   . Lumbar disc disease   . Osteoarthritis   . Hypertension     takes Amlodipine daily  . MI (myocardial infarction) 2005  . Shortness of breath     with exertion and sitting  . GERD (gastroesophageal reflux disease)     takes Omeprazole daily  . Chronic kidney disease (CKD), stage IV (severe)     ESRD  . History of blood transfusion     no abnormal reaction noted  . Hypothyroidism     takes Synthroid daily  . Hypothyroidism   . Depression     takes Zoloft daily   Past Surgical History  Procedure Laterality Date  . Coronary stent placement    . Hernia repair    . Joint replacement Left 04/2005  . Kidney cyst removal Right 08/2008  . Transurethral resection of prostate    . Cataract surgery Left 2012  . Cardiac catheterization  2003/2005    stents  . Coronary artery bypass graft  2005  . Coronary artery bypass graft    . Appendectomy    . Tonsillectomy    . Av fistula placement Right 08/21/2012    Procedure: ARTERIOVENOUS  (AV) FISTULA CREATION;  Surgeon: Larina Earthlyodd F Early, MD;  Location: [facility] OR;  Service: Vascular;  Laterality: Right;   History reviewed. No pertinent family history. History  Substance Use Topics  . Smoking status: Former Smoker    Types: Cigarettes    Quit date: 09/19/1957  . Smokeless tobacco: Never Used  . Alcohol Use: No    Review of Systems  All other systems reviewed and are negative.    Allergies  Codeine  Home Medications   No current outpatient prescriptions on file. BP 211/98  Pulse 82  Temp(Src) 97.6 F (36.4 C) (Oral)  Resp 29  Ht 5\' 10"  (1.778 m)  Wt 149 lb 11.1 oz (67.9 kg)  BMI 21.48 kg/m2  SpO2 99% Physical Exam  Nursing note and vitals reviewed. Constitutional: He is oriented to person, place, and time. No distress.  Elderly, frail  HENT:  Head: Normocephalic and atraumatic.  Right Ear: External ear normal.  Left Ear: External ear normal.  Mouth/Throat: No oropharyngeal exudate (mucous membranes dry).  Eyes: Conjunctivae are normal. Right eye exhibits no discharge.  Left eye exhibits no discharge. No scleral icterus.  Neck: Neck supple. No tracheal deviation present.  Cardiovascular: Normal rate, regular rhythm and intact distal pulses.   Pulmonary/Chest: Effort normal and breath sounds normal. No stridor. No respiratory distress. He has no wheezes. He has no rales.  Abdominal: Soft. Bowel sounds are normal. He exhibits no distension. There is no tenderness. There is no rebound and no guarding.  Musculoskeletal: He exhibits no edema and no tenderness.  Neurological: He is alert and oriented to person, place, and time. He has normal strength. No cranial nerve deficit (no facial droop) or sensory deficit. He exhibits normal muscle tone. He displays no seizure activity. Coordination normal.  Moves all extremities, patient is able to sit up on his own, some difficulty remembering details the last couple of days  Skin: Skin is warm and dry. No rash noted. He is not  diaphoretic.  Psychiatric: He has a normal mood and affect.    ED Course  Procedures (including critical care time) Labs Review Labs Reviewed  CBC WITH DIFFERENTIAL - Abnormal; Notable for the following:    RBC 3.67 (*)    Hemoglobin 11.7 (*)    HCT 34.5 (*)    All other components within normal limits  COMPREHENSIVE METABOLIC PANEL - Abnormal; Notable for the following:    CO2 16 (*)    Glucose, Bld 150 (*)    BUN 77 (*)    Creatinine, Ser 3.63 (*)    GFR calc non Af Amer 13 (*)    GFR calc Af Amer 16 (*)    All other components within normal limits  URINALYSIS, ROUTINE W REFLEX MICROSCOPIC - Abnormal; Notable for the following:    Hgb urine dipstick MODERATE (*)    Protein, ur >300 (*)    All other components within normal limits  URINE MICROSCOPIC-ADD ON - Abnormal; Notable for the following:    Casts HYALINE CASTS (*)    All other components within normal limits  TSH - Abnormal; Notable for the following:    TSH 13.005 (*)    All other components within normal limits  URINALYSIS, ROUTINE W REFLEX MICROSCOPIC - Abnormal; Notable for the following:    Hgb urine dipstick MODERATE (*)    Protein, ur >300 (*)    All other components within normal limits  CBC - Abnormal; Notable for the following:    RBC 3.43 (*)    Hemoglobin 10.8 (*)    HCT 32.5 (*)    All other components within normal limits  RENAL FUNCTION PANEL - Abnormal; Notable for the following:    CO2 18 (*)    Glucose, Bld 112 (*)    BUN 75 (*)    Creatinine, Ser 3.49 (*)    Albumin 3.3 (*)    GFR calc non Af Amer 14 (*)    GFR calc Af Amer 16 (*)    All other components within normal limits  URINE MICROSCOPIC-ADD ON - Abnormal; Notable for the following:    Casts HYALINE CASTS (*)    All other components within normal limits  URINE CULTURE  MAGNESIUM  PHOSPHORUS  TROPONIN I  TROPONIN I  TROPONIN I  LACTIC ACID, PLASMA  HEMOGLOBIN A1C  OCCULT BLOOD X 1 CARD TO LAB, STOOL  POCT I-STAT TROPONIN I    Imaging Review Dg Chest 2 View  05/11/2013   CLINICAL DATA:  Congestion  EXAM: CHEST  2 VIEW  COMPARISON:  03/21/2013  FINDINGS: There is bilateral  interstitial thickening with patchy areas of alveolar airspace opacities. There are bilateral small pleural effusions. Stable cardiomediastinal silhouette. There is evidence of prior CABG. There is prominence of the central pulmonary vasculature.  The osseous structures are unremarkable.  IMPRESSION: Overall findings are most concerning for pulmonary edema.   Electronically Signed   By: Elige Ko   On: 05/11/2013 20:40   Ct Head Wo Contrast  05/11/2013   CLINICAL DATA:  Fatigue.  EXAM: CT HEAD WITHOUT CONTRAST  TECHNIQUE: Contiguous axial images were obtained from the base of the skull through the vertex without intravenous contrast.  COMPARISON:  None.  FINDINGS: Periventricular white matter and corona radiata hypodensities favor chronic ischemic microvascular white matter disease. Hypodensity in the left parietal lobe is somewhat confluent and favors a remote watershed infarct. I doubt that this is late subacute with regard to chronicity.  No intracranial hemorrhage or characteristic findings of acute infarct. No mass lesion observed. There is dense atherosclerotic calcification in the carotid siphons.  IMPRESSION: 1. Chronic microvascular disease, with suspected old left watershed infarct. No acute intracranial findings observed.   Electronically Signed   By: Herbie Baltimore M.D.   On: 05/11/2013 20:40    EKG Interpretation    Date/Time:  Friday May 11 2013 20:17:11 EST Ventricular Rate:  76 PR Interval:  196 QRS Duration: 113 QT Interval:  425 QTC Calculation: 478 R Axis:   0 Text Interpretation:  Sinus rhythm Atrial premature complex Borderline intraventricular conduction delay Nonspecific T abnormalities, lateral leads Borderline prolonged QT interval No significant change vs 10-09-2011 Confirmed by Fayrene Fearing  MD, MARK ([id number]) on 05/11/2013  8:21:09 PM            MDM   1. Chronic kidney disease (CKD), stage IV (severe)   2. Metabolic acidosis   3. Confusion   4. Hypothyroidism   5. Acute pulmonary edema   6. End stage renal disease   7. Metabolic encephalopathy    Pt symptoms related to his worsening renal failure.  Findings were discused with son and patient.  They were anticipating possible dialysis.  Will consult with medical service regarding admission.  Nephrology will need to be consulted as well.  No need for emergent dialysis at admission.   Celene Kras, MD 05/12/13 [phone]

## 2013-05-11 NOTE — ED Notes (Signed)
Per son pt sent from [facility] for evaluation of possible pneumonia/stroke/etc; per son pt sleeping more; congestion; gait different from two months ago per son; son states lot more unsteady;

## 2013-05-12 ENCOUNTER — Encounter (HOSPITAL_COMMUNITY): Payer: Self-pay

## 2013-05-12 DIAGNOSIS — E872 Acidosis, unspecified: Secondary | ICD-10-CM

## 2013-05-12 DIAGNOSIS — E039 Hypothyroidism, unspecified: Secondary | ICD-10-CM

## 2013-05-12 DIAGNOSIS — N184 Chronic kidney disease, stage 4 (severe): Secondary | ICD-10-CM

## 2013-05-12 DIAGNOSIS — R9389 Abnormal findings on diagnostic imaging of other specified body structures: Secondary | ICD-10-CM | POA: Diagnosis present

## 2013-05-12 DIAGNOSIS — F29 Unspecified psychosis not due to a substance or known physiological condition: Secondary | ICD-10-CM

## 2013-05-12 DIAGNOSIS — N186 End stage renal disease: Secondary | ICD-10-CM

## 2013-05-12 DIAGNOSIS — J810 Acute pulmonary edema: Secondary | ICD-10-CM | POA: Diagnosis present

## 2013-05-12 DIAGNOSIS — R410 Disorientation, unspecified: Secondary | ICD-10-CM | POA: Diagnosis present

## 2013-05-12 DIAGNOSIS — G9341 Metabolic encephalopathy: Secondary | ICD-10-CM

## 2013-05-12 LAB — RENAL FUNCTION PANEL
Albumin: 3.3 g/dL — ABNORMAL LOW (ref 3.5–5.2)
BUN: 75 mg/dL — ABNORMAL HIGH (ref 6–23)
CALCIUM: 8.7 mg/dL (ref 8.4–10.5)
CHLORIDE: 105 meq/L (ref 96–112)
CO2: 18 meq/L — AB (ref 19–32)
Creatinine, Ser: 3.49 mg/dL — ABNORMAL HIGH (ref 0.50–1.35)
GFR calc Af Amer: 16 mL/min — ABNORMAL LOW (ref >90)
GFR, EST NON AFRICAN AMERICAN: 14 mL/min — AB (ref >90)
GLUCOSE: 112 mg/dL — AB (ref 70–99)
Phosphorus: 4.2 mg/dL (ref 2.3–4.6)
Potassium: 4.5 mEq/L (ref 3.7–5.3)
Sodium: 139 mEq/L (ref 137–147)

## 2013-05-12 LAB — CBC
HCT: 32.5 % — ABNORMAL LOW (ref 39.0–52.0)
HEMOGLOBIN: 10.8 g/dL — AB (ref 13.0–17.0)
MCH: 31.5 pg (ref 26.0–34.0)
MCHC: 33.2 g/dL (ref 30.0–36.0)
MCV: 94.8 fL (ref 78.0–100.0)
PLATELETS: 199 10*3/uL (ref 150–400)
RBC: 3.43 MIL/uL — AB (ref 4.22–5.81)
RDW: 14.2 % (ref 11.5–15.5)
WBC: 8.9 10*3/uL (ref 4.0–10.5)

## 2013-05-12 LAB — URINALYSIS, ROUTINE W REFLEX MICROSCOPIC
Bilirubin Urine: NEGATIVE
Bilirubin Urine: NEGATIVE
GLUCOSE, UA: NEGATIVE mg/dL
Glucose, UA: NEGATIVE mg/dL
KETONES UR: NEGATIVE mg/dL
KETONES UR: NEGATIVE mg/dL
LEUKOCYTES UA: NEGATIVE
Leukocytes, UA: NEGATIVE
Nitrite: NEGATIVE
Nitrite: NEGATIVE
Specific Gravity, Urine: 1.021 (ref 1.005–1.030)
Specific Gravity, Urine: 1.019 (ref 1.005–1.030)
UROBILINOGEN UA: 0.2 mg/dL (ref 0.0–1.0)
UROBILINOGEN UA: 0.2 mg/dL (ref 0.0–1.0)
pH: 5.5 (ref 5.0–8.0)
pH: 5.5 (ref 5.0–8.0)

## 2013-05-12 LAB — TROPONIN I: Troponin I: <0.3 ng/mL (ref <0.30)

## 2013-05-12 LAB — URINE MICROSCOPIC-ADD ON

## 2013-05-12 LAB — TSH: TSH: 13.005 u[IU]/mL — ABNORMAL HIGH (ref 0.350–4.500)

## 2013-05-12 LAB — HEMOGLOBIN A1C
HEMOGLOBIN A1C: 5.7 % — AB (ref <5.7)
Mean Plasma Glucose: 117 mg/dL — ABNORMAL HIGH (ref <117)

## 2013-05-12 LAB — MRSA PCR SCREENING: MRSA BY PCR: NEGATIVE

## 2013-05-12 LAB — LACTIC ACID, PLASMA: Lactic Acid, Venous: 1.5 mmol/L (ref 0.5–2.2)

## 2013-05-12 LAB — PHOSPHORUS: Phosphorus: 4.3 mg/dL (ref 2.3–4.6)

## 2013-05-12 LAB — MAGNESIUM: Magnesium: 1.9 mg/dL (ref 1.5–2.5)

## 2013-05-12 MED ORDER — CHLORHEXIDINE GLUCONATE 0.12 % MT SOLN
15.0000 mL | Freq: Two times a day (BID) | OROMUCOSAL | Status: DC
  Administered 2013-05-12 – 2013-05-19 (×11): 15 mL via OROMUCOSAL
  Filled 2013-05-12 (×15): qty 15

## 2013-05-12 MED ORDER — LEVOTHYROXINE SODIUM 112 MCG PO TABS
112.0000 ug | ORAL_TABLET | Freq: Every day | ORAL | Status: DC
  Administered 2013-05-12 – 2013-05-19 (×8): 112 ug via ORAL
  Filled 2013-05-12 (×10): qty 1

## 2013-05-12 MED ORDER — FUROSEMIDE 10 MG/ML IJ SOLN
40.0000 mg | Freq: Once | INTRAMUSCULAR | Status: AC
  Administered 2013-05-12: 13:00:00 40 mg via INTRAVENOUS

## 2013-05-12 MED ORDER — FUROSEMIDE 10 MG/ML IJ SOLN
120.0000 mg | Freq: Three times a day (TID) | INTRAVENOUS | Status: DC
  Administered 2013-05-12 – 2013-05-13 (×2): 120 mg via INTRAVENOUS
  Filled 2013-05-12 (×3): qty 12

## 2013-05-12 MED ORDER — ACETAMINOPHEN 650 MG RE SUPP
650.0000 mg | Freq: Four times a day (QID) | RECTAL | Status: DC | PRN
  Administered 2013-05-13: 650 mg via RECTAL
  Filled 2013-05-12: qty 1

## 2013-05-12 MED ORDER — ASPIRIN EC 81 MG PO TBEC
81.0000 mg | DELAYED_RELEASE_TABLET | Freq: Every day | ORAL | Status: DC
  Administered 2013-05-12 – 2013-05-19 (×8): 81 mg via ORAL
  Filled 2013-05-12 (×8): qty 1

## 2013-05-12 MED ORDER — SODIUM CHLORIDE 0.9 % IJ SOLN
3.0000 mL | Freq: Two times a day (BID) | INTRAMUSCULAR | Status: DC
  Administered 2013-05-12 – 2013-05-19 (×12): 3 mL via INTRAVENOUS

## 2013-05-12 MED ORDER — ACETAMINOPHEN 325 MG PO TABS
650.0000 mg | ORAL_TABLET | Freq: Four times a day (QID) | ORAL | Status: DC | PRN
  Administered 2013-05-12: 650 mg via ORAL
  Filled 2013-05-12: qty 2

## 2013-05-12 MED ORDER — DOCUSATE SODIUM 100 MG PO CAPS
100.0000 mg | ORAL_CAPSULE | Freq: Two times a day (BID) | ORAL | Status: DC
  Administered 2013-05-12 – 2013-05-19 (×14): 100 mg via ORAL
  Filled 2013-05-12 (×17): qty 1

## 2013-05-12 MED ORDER — NITROGLYCERIN 2 % TD OINT
0.5000 [in_us] | TOPICAL_OINTMENT | Freq: Four times a day (QID) | TRANSDERMAL | Status: DC
  Administered 2013-05-12: 0.5 [in_us] via TOPICAL
  Filled 2013-05-12: qty 30

## 2013-05-12 MED ORDER — LABETALOL HCL 200 MG PO TABS
200.0000 mg | ORAL_TABLET | Freq: Two times a day (BID) | ORAL | Status: DC
  Administered 2013-05-12 – 2013-05-13 (×2): 200 mg via ORAL
  Filled 2013-05-12 (×4): qty 1

## 2013-05-12 MED ORDER — HYDRALAZINE HCL 20 MG/ML IJ SOLN
10.0000 mg | INTRAMUSCULAR | Status: DC | PRN
  Administered 2013-05-12: 10 mg via INTRAVENOUS
  Filled 2013-05-12: qty 1

## 2013-05-12 MED ORDER — SERTRALINE HCL 50 MG PO TABS
50.0000 mg | ORAL_TABLET | Freq: Every day | ORAL | Status: DC
  Administered 2013-05-12 – 2013-05-19 (×8): 50 mg via ORAL
  Filled 2013-05-12 (×8): qty 1

## 2013-05-12 MED ORDER — AMLODIPINE BESYLATE 2.5 MG PO TABS
2.5000 mg | ORAL_TABLET | Freq: Every day | ORAL | Status: DC
  Administered 2013-05-12: 10:00:00 2.5 mg via ORAL
  Filled 2013-05-12: qty 1

## 2013-05-12 MED ORDER — AMLODIPINE BESYLATE 5 MG PO TABS
5.0000 mg | ORAL_TABLET | Freq: Two times a day (BID) | ORAL | Status: DC
  Administered 2013-05-13 (×2): 5 mg via ORAL
  Filled 2013-05-12 (×6): qty 1

## 2013-05-12 MED ORDER — SODIUM BICARBONATE 650 MG PO TABS
650.0000 mg | ORAL_TABLET | Freq: Four times a day (QID) | ORAL | Status: DC
  Administered 2013-05-12 (×2): 650 mg via ORAL
  Filled 2013-05-12 (×3): qty 1

## 2013-05-12 MED ORDER — ENOXAPARIN SODIUM 30 MG/0.3ML ~~LOC~~ SOLN
30.0000 mg | SUBCUTANEOUS | Status: DC
  Administered 2013-05-12 – 2013-05-14 (×3): 30 mg via SUBCUTANEOUS
  Filled 2013-05-12 (×3): qty 0.3

## 2013-05-12 MED ORDER — ONDANSETRON HCL 4 MG/2ML IJ SOLN
4.0000 mg | Freq: Four times a day (QID) | INTRAMUSCULAR | Status: DC | PRN
  Administered 2013-05-12 – 2013-05-19 (×5): 4 mg via INTRAVENOUS
  Filled 2013-05-12 (×4): qty 2

## 2013-05-12 MED ORDER — DEXTROSE 5 % IV SOLN
160.0000 mg | Freq: Four times a day (QID) | INTRAVENOUS | Status: DC
  Administered 2013-05-12: 160 mg via INTRAVENOUS
  Filled 2013-05-12 (×4): qty 16

## 2013-05-12 MED ORDER — BIOTENE DRY MOUTH MT LIQD
15.0000 mL | Freq: Two times a day (BID) | OROMUCOSAL | Status: DC
  Administered 2013-05-14 – 2013-05-18 (×9): 15 mL via OROMUCOSAL

## 2013-05-12 MED ORDER — MORPHINE SULFATE 2 MG/ML IJ SOLN
2.0000 mg | INTRAMUSCULAR | Status: DC | PRN
  Administered 2013-05-12 – 2013-05-17 (×4): 2 mg via INTRAVENOUS
  Filled 2013-05-12 (×4): qty 1

## 2013-05-12 MED ORDER — BENAZEPRIL HCL 10 MG PO TABS
10.0000 mg | ORAL_TABLET | Freq: Every day | ORAL | Status: DC
  Filled 2013-05-12: qty 1

## 2013-05-12 MED ORDER — NITROGLYCERIN IN D5W 200-5 MCG/ML-% IV SOLN
2.0000 ug/min | INTRAVENOUS | Status: DC
  Administered 2013-05-12: 3 ug/min via INTRAVENOUS
  Filled 2013-05-12: qty 250

## 2013-05-12 NOTE — Progress Notes (Signed)
Utilization Review completed.  

## 2013-05-12 NOTE — Progress Notes (Signed)
Called by RN secondary to worsening WOB, appears pale, no appreciable diuresis with 40 mg Lasix given earlier.  Updated Dr. Arlean HoppingSchertz about patient's condition.  Will move to SDU, initiate bipap, increase Lasix to 160 mg IV Q 6 hours, add nitropaste per Dr. Arlean HoppingSchertz' recommendations.  He will see him later today.  Lik[facility] if he does not respond to medical therapy.  RAMA,CHRISTINA 05/12/2013 3:15 PM

## 2013-05-12 NOTE — H&P (Signed)
[facility] History and Physical  [name]:[id number] DOB: 1920-12-23 DOA: 05/11/2013  Referring physician: Dr. Lynelle Doctor PCP: Default, Provider, MD  Specialists: Nephrology  Chief Complaint: Uremia  HPI: [name] is a 80 y.o. male  This patient comes in today with increasing confusion and fatigue. Although the patient himself thinks that he feels okay, just a little bit tired, his family members feel that he's been much more confused for the last couple of days. He has a history of chronic kidney disease and actually had an AV fistula put in this past year. He has not started dialysis yet. In the emergency room this evening he was found to have a BUN of 77 and a creatinine of 3.63. The last laboratory we have was in June of 2013 and at that time BUN was 35 and creatinine was 2.37. The patient does have a history of stroke and he says he does not have any new focal deficits although his family feels that he is having a little bit harder time getting words out than usual. He does admit to being more with "wobbly" than usual  Review of Systems: The patient denies anorexia, fever, weight loss,, vision loss, decreased hearing, hoarseness, chest pain, syncope, dyspnea on exertion, peripheral edema, , hemoptysis, abdominal pain, melena, hematochezia, severe indigestion/heartburn, hematuria, incontinence, genital sores, muscle weakness, suspicious skin lesions, transient blindness, depression, unusual weight change, abnormal bleeding, enlarged lymph nodes, angioedema, and breast masses.    Past Medical History  Diagnosis Date  . Stroke   . CAD (coronary artery disease)     Prior CABG x 2 last in 2005    . Hypertensive heart disease without CHF   . Hyperlipidemia     not on any medications  . History of TIA (transient ischemic attack)   . Lumbar disc disease   . Osteoarthritis   . Hypertension     takes Amlodipine daily  . MI (myocardial infarction) 2005  . Shortness of breath      with exertion and sitting  . GERD (gastroesophageal reflux disease)     takes Omeprazole daily  . Chronic kidney disease (CKD), stage IV (severe)     ESRD  . History of blood transfusion     no abnormal reaction noted  . Hypothyroidism     takes Synthroid daily  . Hypothyroidism   . Depression     takes Zoloft daily   Past Surgical History  Procedure Laterality Date  . Coronary stent placement    . Hernia repair    . Joint replacement Left 04/2005  . Kidney cyst removal Right 08/2008  . Transurethral resection of prostate    . Cataract surgery Left 2012  . Cardiac catheterization  2003/2005    stents  . Coronary artery bypass graft  2005  . Coronary artery bypass graft    . Appendectomy    . Tonsillectomy    . Av fistula placement Right 08/21/2012    Procedure: ARTERIOVENOUS (AV) FISTULA CREATION;  Surgeon: Larina Earthly, MD;  Location: [facility] OR;  Service: Vascular;  Laterality: Right;   Social History:  reports that he quit smoking about 55 years ago. His smoking use included Cigarettes. He smoked 0.00 packs per day. He has never used smokeless tobacco. He reports that he does not drink alcohol or use illicit drugs. Patient lives at home and does most of his ADLs by himself.  Allergies  Allergen Reactions  . Codeine Other (See Comments)  Unknown reaction    History reviewed. No pertinent family history.  Prior to Admission medications   Medication Sig Start Date End Date Taking? Authorizing Provider  acetaminophen (TYLENOL) 500 MG tablet Take 500 mg by mouth daily as needed for pain.    Yes Historical Provider, MD  amLODipine (NORVASC) 2.5 MG tablet Take 2.5 mg by mouth daily.   Yes Historical Provider, MD  aspirin EC 81 MG tablet Take 81 mg by mouth daily.   Yes Historical Provider, MD  benazepril (LOTENSIN) 10 MG tablet Take 10 mg by mouth daily.   Yes Historical Provider, MD  levothyroxine (SYNTHROID, LEVOTHROID) 112 MCG tablet Take 112 mcg by mouth daily.    Yes  Historical Provider, MD  sertraline (ZOLOFT) 100 MG tablet Take 50 mg by mouth daily.   Yes Historical Provider, MD   Physical Exam: Filed Vitals:   05/12/13 0100  BP: 182/92  Pulse: 81  Temp: 97.4 F (36.3 C)  Resp: 16    Nursing note and vitals reviewed. Constitutional: He is oriented to person, place, and time. He  appears well-developed and well-nourished.  HENT:   Mouth/Throat: Oropharynx is clear and moist. No oropharyngeal exudate.  Eyes: Conjunctivae are normal. Pupils are equal, round, and reactive to light.  Neck: Normal range of motion. Neck supple. No thyromegaly present.  Cardiovascular: Normal rate, regular rhythm and normal heart sounds.   Pulmonary/Chest: Effort normal and breath sounds normal.  Abdominal: Soft. Bowel sounds are normal.  no distension. There is no tenderness. There is no rebound.  Lymphadenopathy:    He has no cervical adenopathy.  Neurological: He is alert and oriented to person, place, and time although quite slow to answer.. He has normal reflexes.  cranial nerves are intact grossly. No flapping. Skin: Skin is warm and dry.He has no concerning moles or skin lesions Psychiatric: He has a normal mood and affect. His behavior is normal. He does have some stuttering and some expressive aphasia.   Labs on Admission:  Basic Metabolic Panel:  Recent Labs Lab 05/11/13 1950  NA 137  K 4.6  CL 104  CO2 16*  GLUCOSE 150*  BUN 77*  CREATININE 3.63*  CALCIUM 9.1   Liver Function Tests:  Recent Labs Lab 05/11/13 1950  AST 28  ALT 23  ALKPHOS 88  BILITOT 0.4  PROT 7.7  ALBUMIN 3.5   No results found for this basename: LIPASE, AMYLASE,  in the last 168 hours No results found for this basename: AMMONIA,  in the last 168 hours CBC:  Recent Labs Lab 05/11/13 1950  WBC 9.5  NEUTROABS 7.0  HGB 11.7*  HCT 34.5*  MCV 94.0  PLT 224   Cardiac Enzymes: No results found for this basename: CKTOTAL, CKMB, CKMBINDEX, TROPONINI,  in the last  168 hours  BNP (last 3 results) No results found for this basename: PROBNP,  in the last 8760 hours CBG: No results found for this basename: GLUCAP,  in the last 168 hours  Radiological Exams on Admission: Dg Chest 2 View  05/11/2013   CLINICAL DATA:  Congestion  EXAM: CHEST  2 VIEW  COMPARISON:  03/21/2013  FINDINGS: There is bilateral interstitial thickening with patchy areas of alveolar airspace opacities. There are bilateral small pleural effusions. Stable cardiomediastinal silhouette. There is evidence of prior CABG. There is prominence of the central pulmonary vasculature.  The osseous structures are unremarkable.  IMPRESSION: Overall findings are most concerning for pulmonary edema.   Electronically Signed   By:  Hetal  Patel   On: 05/11/2013 20:40   Ct Head Wo Contrast  05/11/2013   CLINICAL DATA:  Fatigue.  EXAM: CT HEAD WITHOUT CONTRAST  TECHNIQUE: Contiguous axial images were obtained from the base of the skull through the vertex without intravenous contrast.  COMPARISON:  None.  FINDINGS: Periventricular white matter and corona radiata hypodensities favor chronic ischemic microvascular white matter disease. Hypodensity in the left parietal lobe is somewhat confluent and favors a remote watershed infarct. I doubt that this is late subacute with regard to chronicity.  No intracranial hemorrhage or characteristic findings of acute infarct. No mass lesion observed. There is dense atherosclerotic calcification in the carotid siphons.  IMPRESSION: 1. Chronic microvascular disease, with suspected old left watershed infarct. No acute intracranial findings observed.   Electronically Signed   By: Herbie Baltimore M.D.   On: 05/11/2013 20:40    EKG: Independently reviewed.   Assessment/Plan Patient Active Problem List   Diagnosis Date Noted  . Confusion 05/12/2013  . Renal failure 05/11/2013  . End stage renal disease 09/19/2012  . CAD (coronary artery disease)   . Hypertensive heart disease  without CHF   . Chronic kidney disease (CKD), stage IV (severe)   . Hypothyroidism   . History of TIA (transient ischemic attack)   . Lumbar disc disease   . Hyperlipidemia   . Osteoarthritis      1. Increased confusion: This very well could be as a result of his failing kidneys. We have consulted nephrology. I am also concerned that the symptoms could be another stroke. Will check an MRI.  2. Chronic kidney disease: See above. He also has an acidosis with a bicarbonate of 16 on his BMP. If he gets sick or could consider giving him bicarbonate but I don't think were at that point yet. 3. History of coronary artery disease status post CABG and stents: We'll monitor him on telemetry, check a set of troponins 4. Hypertension: We'll continue his medications as home. 5. Anemia: Mild. Could be from his renal disease. We'll check a Hemoccult card to make sure there is no GI bleeding.  Nephrology has are to been consulted through the  Code Status: Full Family Communication: None Disposition Plan: Admit to telemetry  Time spent: 45 min  Acey Lav [facility] Pager [phone]  If 7PM-7AM, please contact night-coverage [email or web address] Password [email or web address] 05/12/2013, 1:45 AM

## 2013-05-12 NOTE — Progress Notes (Addendum)
[facility] PROGRESS NOTE   [name] NWG:[id number] DOB: 09-12-1920 DOA: 05/11/2013 PCP: Default, Provider, MD  Brief narrative: [name] is an 80 y.o. male with a PMH of CAD status post CABG, stage IV chronic kidney disease status post-AV fistula who was admitted on 05/12/2012 with increasing confusion and fatigue. Upon initial evaluation in the ED, he was found to have a BUN of 77 and a creatinine of 3.63 (prior creatinine documented at 2.37 6/13).  Assessment/Plan: Principal Problem:   Metabolic encephalopathy / confusion The patient's mental status changes and increasing fatigue are worrisome for acute uremia. Nephrology consultation has been requested. CT scan of the head done on admission showed no evidence of acute intracranial abnormalities. MRI ordered to ascertain no evidence of stroke, but exam non-focal so would hold off on this for now. Active Problems:   CAD (coronary artery disease) Patient is status post CABG. Troponins negative. Continue aspirin.   Hypertensive heart disease  Continue Norvasc and hold benazepril given acute worsening of renal function.   Acute renal failure in the setting of Chronic kidney disease (CKD), stage IV (severe) / Acute pulmonary edema / Metabolic acidosis Patient's baseline creatinine is 2.3. Current creatinine elevated over his usual baseline values and now has a metabolic acidosis. He is likely progressing to end-stage renal disease. Avoid nephrotoxins. Nephrology consultation pending. Hydrated overnight.  Stop IVF.  Lasix 40 mg IV x 1 now.  Start oral sodium bicarbonate to address metabolic acidosis.   Hypothyroidism Continue Synthroid.   Abnormal chest x-ray Pulmonary edema noted on chest radiography.    Code Status: Full. Family Communication: No family at bedside.  Son updated by telephone. Disposition Plan: Home when stable.   IV access:  Peripheral IV  AV fistula right forearm, + thrill/bruit  Medical  Consultants:  Nephrology  Other Consultants:  None  Anti-infectives:  None  HPI/Subjective: [name] is rambling, poor concentration, unable to answer questions without perseverating.  He is dyspneic with increased work of breathing.  Occasional cough.  Denies fever, myalgias.  Objective: Filed Vitals:   05/11/13 1951 05/12/13 0100 05/12/13 0530 05/12/13 0700  BP:  182/92 159/89   Pulse:  81 85   Temp: 95.8 F (35.4 C) 97.4 F (36.3 C) 98.1 F (36.7 C) 97.4 F (36.3 C)  TempSrc: Rectal Oral Oral   Resp:  16 14   Height:  5\' 9"  (1.753 m)    Weight:  67.767 kg (149 lb 6.4 oz)    SpO2:  97% 99%     Intake/Output Summary (Last 24 hours) at 05/12/13 0828 Last data filed at 05/12/13 0809  Gross per 24 hour  Intake 803.33 ml  Output    750 ml  Net  53.33 ml    Exam: Gen:  Mild respiratory distress Cardiovascular:  RRR, No M/R/G Respiratory:  Lungs with bilateral rales Gastrointestinal:  Abdomen soft, NT/ND, + BS Extremities:  No C/E/C  Data Reviewed: Basic Metabolic Panel:  Recent Labs Lab 05/11/13 1950 05/12/13 0235  NA 137 139  K 4.6 4.5  CL 104 105  CO2 16* 18*  GLUCOSE 150* 112*  BUN 77* 75*  CREATININE 3.63* 3.49*  CALCIUM 9.1 8.7  MG  --  1.9  PHOS  --  4.3  4.2   GFR Estimated Creatinine Clearance: 13.5 ml/min (by C-G formula based on Cr of 3.49). Liver Function Tests:  Recent Labs Lab 05/11/13 1950 05/12/13 0235  AST 28  --   ALT 23  --  ALKPHOS 88  --   BILITOT 0.4  --   PROT 7.7  --   ALBUMIN 3.5 3.3*   CBC:  Recent Labs Lab 05/11/13 1950 05/12/13 0235  WBC 9.5 8.9  NEUTROABS 7.0  --   HGB 11.7* 10.8*  HCT 34.5* 32.5*  MCV 94.0 94.8  PLT 224 199   Cardiac Enzymes:  Recent Labs Lab 05/12/13 0235  TROPONINI <0.30   Microbiology No results found for this or any previous visit (from the past 240 hour(s)).   Procedures and Diagnostic Studies: Dg Chest 2 View  05/11/2013   CLINICAL DATA:  Congestion  EXAM:  CHEST  2 VIEW  COMPARISON:  03/21/2013  FINDINGS: There is bilateral interstitial thickening with patchy areas of alveolar airspace opacities. There are bilateral small pleural effusions. Stable cardiomediastinal silhouette. There is evidence of prior CABG. There is prominence of the central pulmonary vasculature.  The osseous structures are unremarkable.  IMPRESSION: Overall findings are most concerning for pulmonary edema.   Electronically Signed   By: Elige Ko   On: 05/11/2013 20:40   Ct Head Wo Contrast  05/11/2013   CLINICAL DATA:  Fatigue.  EXAM: CT HEAD WITHOUT CONTRAST  TECHNIQUE: Contiguous axial images were obtained from the base of the skull through the vertex without intravenous contrast.  COMPARISON:  None.  FINDINGS: Periventricular white matter and corona radiata hypodensities favor chronic ischemic microvascular white matter disease. Hypodensity in the left parietal lobe is somewhat confluent and favors a remote watershed infarct. I doubt that this is late subacute with regard to chronicity.  No intracranial hemorrhage or characteristic findings of acute infarct. No mass lesion observed. There is dense atherosclerotic calcification in the carotid siphons.  IMPRESSION: 1. Chronic microvascular disease, with suspected old left watershed infarct. No acute intracranial findings observed.   Electronically Signed   By: Herbie Baltimore M.D.   On: 05/11/2013 20:40    Scheduled Meds: . amLODipine  2.5 mg Oral Daily  . aspirin EC  81 mg Oral Daily  . benazepril  10 mg Oral Daily  . docusate sodium  100 mg Oral BID  . enoxaparin (LOVENOX) injection  30 mg Subcutaneous Q24H  . levothyroxine  112 mcg Oral QAC breakfast  . sertraline  50 mg Oral Daily  . sodium chloride  3 mL Intravenous Q12H   Continuous Infusions: . sodium chloride 125 mL/hr at 05/11/13 2102    Time spent: 35 minutes.  The patient is critically ill and requires high complexity decision making, coordination of care with  nephrologist.   LOS: 1 day   [name]  [facility] Pager [phone].   *Please note that the hospitalists switch teams on Wednesdays. Please call the flow manager at [phone] if you are having difficulty reaching the hospitalist taking care of this patient as she can update you and provide the most up-to-date pager number of provider caring for the patient. If 8PM-8AM, please contact night-coverage at [email or web address], password [facility]  05/12/2013, 8:28 AM

## 2013-05-12 NOTE — Progress Notes (Signed)
Dr. Darnelle Catalanama given update via phone regarding Pt's status. Pt with no measurable urine output after IV Lasix. Pt's resp pattern more labored and color paler. Will moniter Pt closely and carry out new orders given.

## 2013-05-12 NOTE — Progress Notes (Signed)
Arrived to pt room, found pt with bipap mask off.  RN at bedside.  Pt has complaints of headache and nausea.  Pt placed on 2lnc at this time.  [id number], rr26, sats95%.  Bipap remains in room on standby.

## 2013-05-12 NOTE — Progress Notes (Signed)
Pt transferred to stepdown Esmond HarpsStephanie Dillion RN accepting report.

## 2013-05-12 NOTE — Consult Note (Signed)
Renal Service Consult Note [facility]  Wayne NanasSamuel W [name] 05/12/2013 [name] D Requesting Physician:  Dr Darnelle Catalanama  Reason for Consult:  CKD patient with resp distress and HTN crisis HPI: The patient is a 80 y.o. year-old with hx of CVA, mild dementia, HTN and CKD stage IV followed by [facility] presented early am today with reports of worsening confusion and somnolence for a period of several days to a week.  Usual creatinine was mid 2's, now 3.5 per ED labs.  Patient admitted and given IV fluids. Then developed resp distress late this am and was treated with Bipap and high dose IV lasix with improvement. CXR confirmed new CHF pattern. Has voided 1000 cc since IV lasix given. Off bipap now.   Pt without complaints, poor historian.  Son provides history. Main concern is worsening of baseline mild confusion and somnolence. No n/v/d, no abd pain.    Denies any recent CP, abd pain, dark urine, voiding problems. +hx of TURP.  No ha, blurred vision.   Past Medical History  Past Medical History  Diagnosis Date  . Stroke   . CAD (coronary artery disease)     Prior CABG x 2 last in 2005    . Hypertensive heart disease without CHF   . Hyperlipidemia     not on any medications  . History of TIA (transient ischemic attack)   . Lumbar disc disease   . Osteoarthritis   . Hypertension     takes Amlodipine daily  . MI (myocardial infarction) 2005  . Shortness of breath     with exertion and sitting  . GERD (gastroesophageal reflux disease)     takes Omeprazole daily  . Chronic kidney disease (CKD), stage IV (severe)     ESRD  . History of blood transfusion     no abnormal reaction noted  . Hypothyroidism     takes Synthroid daily  . Hypothyroidism   . Depression     takes Zoloft daily   Past Surgical History  Past Surgical History  Procedure Laterality Date  . Coronary stent placement    . Hernia repair    . Joint replacement Left 04/2005  . Kidney cyst removal Right 08/2008   . Transurethral resection of prostate    . Cataract surgery Left 2012  . Cardiac catheterization  2003/2005    stents  . Coronary artery bypass graft  2005  . Coronary artery bypass graft    . Appendectomy    . Tonsillectomy    . Av fistula placement Right 08/21/2012    Procedure: ARTERIOVENOUS (AV) FISTULA CREATION;  Surgeon: Larina Earthlyodd F Early, MD;  [facility];  Service: Vascular;  Laterality: Right;   Family History History reviewed. No pertinent family history. Social History  reports that he quit smoking about 55 years ago. His smoking use included Cigarettes. He smoked 0.00 packs per day. He has never used smokeless tobacco. He reports that he does not drink alcohol or use illicit drugs. Allergies  Allergies  Allergen Reactions  . Codeine Other (See Comments)    Unknown reaction   Home medications Prior to Admission medications   Medication Sig Start Date End Date Taking? Authorizing Provider  acetaminophen (TYLENOL) 500 MG tablet Take 500 mg by mouth daily as needed for pain.    Yes Historical Provider, MD  amLODipine (NORVASC) 2.5 MG tablet Take 2.5 mg by mouth daily.   Yes Historical Provider, MD  aspirin EC 81 MG tablet Take 81 mg by  mouth daily.   Yes Historical Provider, MD  benazepril (LOTENSIN) 10 MG tablet Take 10 mg by mouth daily.   Yes Historical Provider, MD  levothyroxine (SYNTHROID, LEVOTHROID) 112 MCG tablet Take 112 mcg by mouth daily.    Yes Historical Provider, MD  sertraline (ZOLOFT) 100 MG tablet Take 50 mg by mouth daily.   Yes Historical Provider, MD   Liver Function Tests  Recent Labs Lab 05/11/13 1950 05/12/13 0235  AST 28  --   ALT 23  --   ALKPHOS 88  --   BILITOT 0.4  --   PROT 7.7  --   ALBUMIN 3.5 3.3*   No results found for this basename: LIPASE, AMYLASE,  in the last 168 hours CBC  Recent Labs Lab 05/11/13 1950 05/12/13 0235  WBC 9.5 8.9  NEUTROABS 7.0  --   HGB 11.7* 10.8*  HCT 34.5* 32.5*  MCV 94.0 94.8  PLT 224 199    Basic Metabolic Panel  Recent Labs Lab 05/11/13 1950 05/12/13 0235  NA 137 139  K 4.6 4.5  CL 104 105  CO2 16* 18*  GLUCOSE 150* 112*  BUN 77* 75*  CREATININE 3.63* 3.49*  CALCIUM 9.1 8.7  PHOS  --  4.3  4.2    Exam  Blood pressure 203/100, pulse 83, temperature 97.6 F (36.4 C), temperature source Oral, resp. rate 32, height 5\' 10"  (1.778 m), weight 67.9 kg (149 lb 11.1 oz), SpO2 96.00%. Elderly WM in no distress, somewhat frail No rash, cyanosis Sclera anicteric, throat clear No jvd Bibasilar crackles 1/4 up RRR no M or rub Abd soft, nt/nd, no ascites 1+ pretibial and pedal edema bilat Neuro stuttering speech, some mild confusion relating history, ox 3 R arm AVF mature and patent  Lab: Hb 10.8, creat 3.6, phos 4.3, adj Ca 9.3  Assessment/Plan: 1. CKD stage 4b/5: presenting with worsening of chronic mild confusion and somnolence which is new >  I think this is enough to proceed with dialysis initiation in the frail elderly male who is at risk for progressing rapidly if we let the process proceed further. Will plan diurese over weekend and start HD on Monday. Have d/w pt and family who are in agreement.  Will need transfer to [facility] tomorrow. AVF is mature and ready to use 2. Anemia of CKD: Hb 10.8, observe 3. 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 Hx CVA w mild dementia 5. Hx CABG '93 w redo 2005 6. HTN/volume: BP up with vol excess, diurese and increase oral BP meds, use prn IV hydralazine for now. Vol excess is not dramatic.  7. Pulm edema / resp distress: improving with diuresis 8. Hypothyroidism 9. Depression- on zoloft   Wayne Moselle MD (pgr) [phone]    ([phone] 05/12/2013, 8:01 PM

## 2013-05-13 ENCOUNTER — Inpatient Hospital Stay (HOSPITAL_COMMUNITY): Payer: Medicare Other

## 2013-05-13 DIAGNOSIS — I10 Essential (primary) hypertension: Secondary | ICD-10-CM

## 2013-05-13 DIAGNOSIS — D638 Anemia in other chronic diseases classified elsewhere: Secondary | ICD-10-CM | POA: Diagnosis present

## 2013-05-13 DIAGNOSIS — R0603 Acute respiratory distress: Secondary | ICD-10-CM | POA: Diagnosis present

## 2013-05-13 DIAGNOSIS — N2581 Secondary hyperparathyroidism of renal origin: Secondary | ICD-10-CM | POA: Diagnosis present

## 2013-05-13 DIAGNOSIS — I169 Hypertensive crisis, unspecified: Secondary | ICD-10-CM | POA: Diagnosis present

## 2013-05-13 DIAGNOSIS — Z8673 Personal history of transient ischemic attack (TIA), and cerebral infarction without residual deficits: Secondary | ICD-10-CM

## 2013-05-13 DIAGNOSIS — J984 Other disorders of lung: Secondary | ICD-10-CM

## 2013-05-13 DIAGNOSIS — I5043 Acute on chronic combined systolic (congestive) and diastolic (congestive) heart failure: Secondary | ICD-10-CM | POA: Diagnosis present

## 2013-05-13 LAB — RENAL FUNCTION PANEL
Albumin: 3.4 g/dL — ABNORMAL LOW (ref 3.5–5.2)
BUN: 79 mg/dL — AB (ref 6–23)
CO2: 19 mEq/L (ref 19–32)
Calcium: 8.7 mg/dL (ref 8.4–10.5)
Chloride: 103 mEq/L (ref 96–112)
Creatinine, Ser: 3.77 mg/dL — ABNORMAL HIGH (ref 0.50–1.35)
GFR calc Af Amer: 15 mL/min — ABNORMAL LOW (ref >90)
GFR, EST NON AFRICAN AMERICAN: 13 mL/min — AB (ref >90)
Glucose, Bld: 161 mg/dL — ABNORMAL HIGH (ref 70–99)
PHOSPHORUS: 6.1 mg/dL — AB (ref 2.3–4.6)
Potassium: 4.8 mEq/L (ref 3.7–5.3)
Sodium: 141 mEq/L (ref 137–147)

## 2013-05-13 LAB — COMPREHENSIVE METABOLIC PANEL
ALK PHOS: 83 U/L (ref 39–117)
ALT: 23 U/L (ref 0–53)
AST: 29 U/L (ref 0–37)
Albumin: 3.3 g/dL — ABNORMAL LOW (ref 3.5–5.2)
BILIRUBIN TOTAL: 0.6 mg/dL (ref 0.3–1.2)
BUN: 76 mg/dL — AB (ref 6–23)
CHLORIDE: 103 meq/L (ref 96–112)
CO2: 18 mEq/L — ABNORMAL LOW (ref 19–32)
Calcium: 8.7 mg/dL (ref 8.4–10.5)
Creatinine, Ser: 3.67 mg/dL — ABNORMAL HIGH (ref 0.50–1.35)
GFR calc Af Amer: 15 mL/min — ABNORMAL LOW (ref >90)
GFR calc non Af Amer: 13 mL/min — ABNORMAL LOW (ref >90)
Glucose, Bld: 186 mg/dL — ABNORMAL HIGH (ref 70–99)
POTASSIUM: 4.8 meq/L (ref 3.7–5.3)
Sodium: 139 mEq/L (ref 137–147)
TOTAL PROTEIN: 7.3 g/dL (ref 6.0–8.3)

## 2013-05-13 LAB — IRON AND TIBC
Iron: 28 ug/dL — ABNORMAL LOW (ref 42–135)
Saturation Ratios: 9 % — ABNORMAL LOW (ref 20–55)
TIBC: 312 ug/dL (ref 215–435)
UIBC: 284 ug/dL (ref 125–400)

## 2013-05-13 LAB — CBC
HCT: 33.2 % — ABNORMAL LOW (ref 39.0–52.0)
Hemoglobin: 11.3 g/dL — ABNORMAL LOW (ref 13.0–17.0)
MCH: 32.4 pg (ref 26.0–34.0)
MCHC: 34 g/dL (ref 30.0–36.0)
MCV: 95.1 fL (ref 78.0–100.0)
Platelets: 182 K/uL (ref 150–400)
RBC: 3.49 MIL/uL — ABNORMAL LOW (ref 4.22–5.81)
RDW: 14.3 % (ref 11.5–15.5)
WBC: 12.2 K/uL — ABNORMAL HIGH (ref 4.0–10.5)

## 2013-05-13 LAB — URINE CULTURE
Colony Count: NO GROWTH
Culture: NO GROWTH

## 2013-05-13 LAB — HEPATITIS B SURFACE ANTIGEN: HEP B S AG: NEGATIVE

## 2013-05-13 LAB — HEPATITIS B CORE ANTIBODY, IGM: HEP B C IGM: NONREACTIVE

## 2013-05-13 LAB — HEPATITIS B SURFACE ANTIBODY,QUALITATIVE: Hep B S Ab: NEGATIVE

## 2013-05-13 LAB — FERRITIN: FERRITIN: 46 ng/mL (ref 22–322)

## 2013-05-13 MED ORDER — NEPRO/CARBSTEADY PO LIQD: 237.0000 mL | ORAL | Status: DC | PRN

## 2013-05-13 MED ORDER — HEPARIN SODIUM (PORCINE) 1000 UNIT/ML DIALYSIS
1000.0000 [IU] | INTRAMUSCULAR | Status: DC | PRN
  Filled 2013-05-13: qty 1

## 2013-05-13 MED ORDER — LIDOCAINE-PRILOCAINE 2.5-2.5 % EX CREA: 1.0000 "application " | TOPICAL_CREAM | CUTANEOUS | Status: DC | PRN

## 2013-05-13 MED ORDER — LABETALOL HCL 200 MG PO TABS
200.0000 mg | ORAL_TABLET | Freq: Two times a day (BID) | ORAL | Status: DC
  Administered 2013-05-14 – 2013-05-17 (×3): 200 mg via ORAL
  Filled 2013-05-13 (×12): qty 1

## 2013-05-13 MED ORDER — ALTEPLASE 2 MG IJ SOLR
2.0000 mg | Freq: Once | INTRAMUSCULAR | Status: AC | PRN
  Filled 2013-05-13: qty 2

## 2013-05-13 MED ORDER — SODIUM CHLORIDE 0.9 % IV SOLN: 100.0000 mL | INTRAVENOUS | Status: DC | PRN

## 2013-05-13 MED ORDER — LIDOCAINE HCL (PF) 1 % IJ SOLN: 5.0000 mL | INTRAMUSCULAR | Status: DC | PRN

## 2013-05-13 MED ORDER — PENTAFLUOROPROP-TETRAFLUOROETH EX AERO: 1.0000 "application " | INHALATION_SPRAY | CUTANEOUS | Status: DC | PRN

## 2013-05-13 MED ORDER — HEPARIN SODIUM (PORCINE) 1000 UNIT/ML DIALYSIS
2000.0000 [IU] | INTRAMUSCULAR | Status: DC | PRN
  Filled 2013-05-13: qty 2

## 2013-05-13 NOTE — Progress Notes (Signed)
Pt seen, remains on 2lnc, HR61, rr21-28, sats95%, BP 140/94.  Pt continues to be nauseated.  No respiratory distress or increased wob noted at this time.  Bipap remains in room on standby.

## 2013-05-13 NOTE — Progress Notes (Signed)
  [facility] Progress Note   Subjective:  3000 cc UOP yest w IV lasix, no complaints per pt, nurse says +nausea and HA persisted through the night   Filed Vitals:   05/13/13 0200 05/13/13 0300 05/13/13 0400 05/13/13 0500  BP: 123/60 149/81 142/77 119/60  Pulse: 59 58 60 55  Temp:      TempSrc:      Resp: 21 22 20 18   Height:      Weight:   68.2 kg (150 lb 5.7 oz)   SpO2: 92% 95% 97% 94%  Elderly WM in no distress, frail, wet gurgling subtle cough from time to time  No jvd  R clear, ?egophony L base RRR no M or rub  Abd soft, nt/nd, no ascites  Mild edema resolved Neuro stuttering speech, some mild confusion relating history, ox 3  R arm AVF patent   Assessment/Plan:  1. Pulmonary edema / acute resp distress: resolved clinically > repeat xray, stop IV lasix, change po lasix 2. CKD stage 4b/5: uremic w mental decline and somnolence > first HD tomorrow, use R arm AVF  3. Anemia of CKD: Hb 10.8, observe 4. 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 Hx CVA w mild dementia 6. Hx CABG '93 w redo 2005 7. HTN'sive crisis: better on po meds and prn hydralazine 8. Hypothyroidism 9. Depression- on zoloft  Vinson Moselleob Rashidi Loh MD pager [phone]    cell [phone] 05/13/2013, 7:12 AM   Recent Labs Lab 05/12/13 0235 05/13/13 0150 05/13/13 0400  NA 139 139 141  K 4.5 4.8 4.8  CL 105 103 103  CO2 18* 18* 19  GLUCOSE 112* 186* 161*  BUN 75* 76* 79*  CREATININE 3.49* 3.67* 3.77*  CALCIUM 8.7 8.7 8.7  PHOS 4.3  4.2  --  6.1*    Recent Labs Lab 05/11/13 1950 05/12/13 0235 05/13/13 0150 05/13/13 0400  AST 28  --  29  --   ALT 23  --  23  --   ALKPHOS 88  --  83  --   BILITOT 0.4  --  0.6  --   PROT 7.7  --  7.3  --   ALBUMIN 3.5 3.3* 3.3* 3.4*    Recent Labs Lab 05/11/13 1950 05/12/13 0235 05/13/13 0150  WBC 9.5 8.9 12.2*  NEUTROABS 7.0  --   --   HGB 11.7* 10.8* 11.3*  HCT 34.5* 32.5* 33.2*  MCV 94.0 94.8 95.1  PLT 224 199 182   .  amLODipine  5 mg Oral BID  . antiseptic oral rinse  15 mL Mouth Rinse q12n4p  . aspirin EC  81 mg Oral Daily  . chlorhexidine  15 mL Mouth Rinse BID  . docusate sodium  100 mg Oral BID  . enoxaparin (LOVENOX) injection  30 mg Subcutaneous Q24H  . furosemide  120 mg Intravenous Q8H  . labetalol  200 mg Oral BID  . levothyroxine  112 mcg Oral QAC breakfast  . sertraline  50 mg Oral Daily  . sodium chloride  3 mL Intravenous Q12H     acetaminophen, acetaminophen, hydrALAZINE, morphine injection, ondansetron (ZOFRAN) IV

## 2013-05-13 NOTE — Progress Notes (Addendum)
[facility] PROGRESS NOTE   [name] EAV:[id number] DOB: 1920/11/07 DOA: 05/11/2013 PCP: Default, Provider, MD  Brief narrative: [name] is an 80 y.o. male with a PMH of CAD status post CABG, stage IV chronic kidney disease status post-AV fistula who was admitted on 05/12/2012 with increasing confusion and fatigue. Upon initial evaluation in the ED, he was found to have a BUN of 77 and a creatinine of 3.63 (prior creatinine documented at 2.37 6/13).  Assessment/Plan: Principal Problem:   Metabolic encephalopathy / confusion / H/O CVA The patient's mental status changes and increasing fatigue are worrisome for acute uremia. Seen by nephrologist 05/12/13 with plans to proceed with hemodialysis on 05/14/13. CT scan of the head done on admission showed no evidence of acute intracranial abnormalities. MRI ordered to ascertain no evidence of stroke, but patient to unstable to proceed with MRI 05/12/13. Repeat CT of the head done which showed no acute intracranial process, stable atrophy with chronic microvascular ischemic disease and a remote left parietal infarct. Active Problems:   Depression Continue Zoloft.   Secondary hyperparathyroidism Not currently on phosphorus binders. Followup PTH.   Anemia of chronic disease Hemoglobin stable with no indication for transfusion.   CAD (coronary artery disease) Patient is status post CABG. Troponins negative. Continue aspirin.   Hypertensive heart disease / hypertensive crisis  Continue Norvasc. Benazepril on hold secondary to worsening renal function. Blood pressure markedly elevated yesterday (reached a high of 211/104). Treated with nitroglycerin drip, which has now been weaned off. Blood pressure now improved. Continue Labetalol and when necessary hydralazine.   Acute renal failure in the setting of Chronic kidney disease (CKD), stage IV (severe) / Acute pulmonary edema / Metabolic acidosis Patient's baseline creatinine is 2.3. Current  creatinine elevated over his usual baseline values and now has a metabolic acidosis (improved with oral bicarbonate). He is likely progressing to end-stage renal disease. Avoid nephrotoxins. Seen by nephrology 05/12/13 with [facility]N on 05/14/13.   Hypothyroidism Continue Synthroid.   Abnormal chest x-ray / Acute on chronic systolic and diastolic CHF secondary to volume overload in the setting of end-stage renal disease / Acute respiratory distress Pulmonary edema noted on chest radiography.  Patient went into flash pulmonary edema on 05/12/13 necessitating transfer to SDU and temporary Bipap. He was treated with 160 mg of Lasix every 6 hours and a nitroglycerin drip. Intake/output balance is negative by 2.7 L. 2-D echo done 10/10/11 with EF 40-45% and grade 1 diastolic dysfunction.  Respiratory status much improved this morning.  Code Status: Full. Family Communication: No family at bedside.  Son updated by telephone and informed of [facility]. Disposition Plan: Home versus SNF when stable, depending on progress/home support.   IV access:  Peripheral IV  AV fistula right forearm, + thrill/bruit  Medical Consultants:  Dr. Delano Metzobert Schertz, Nephrology  Other Consultants:  None  Anti-infectives:  None  HPI/Subjective: [name] is breathing much better this a.m.  Remains confused.  Denies pain, nausea, vomiting, dyspnea.  Objective: Filed Vitals:   05/13/13 0200 05/13/13 0300 05/13/13 0400 05/13/13 0500  BP: 123/60 149/81 142/77 119/60  Pulse: 59 58 60 55  Temp:      TempSrc:      Resp: 21 22 20 18   Height:      Weight:   68.2 kg (150 lb 5.7 oz)   SpO2: 92% 95% 97% 94%    Intake/Output Summary (Last 24 hours)  at 05/13/13 0716 Last data filed at 05/13/13 0500  Gross per 24 hour  Intake    240 ml  Output   3000 ml  Net  -2760 ml    Exam: Gen:  NAD Cardiovascular:  RRR, No M/R/G Respiratory:  Lungs clearer with  faint bibasilar rales, much improved. Gastrointestinal:  Abdomen soft, NT/ND, + BS Extremities:  No C/E/C  Data Reviewed: Basic Metabolic Panel:  Recent Labs Lab 05/11/13 1950 05/12/13 0235 05/13/13 0150 05/13/13 0400  NA 137 139 139 141  K 4.6 4.5 4.8 4.8  CL 104 105 103 103  CO2 16* 18* 18* 19  GLUCOSE 150* 112* 186* 161*  BUN 77* 75* 76* 79*  CREATININE 3.63* 3.49* 3.67* 3.77*  CALCIUM 9.1 8.7 8.7 8.7  MG  --  1.9  --   --   PHOS  --  4.3  4.2  --  6.1*   GFR Estimated Creatinine Clearance: 12.6 ml/min (by C-G formula based on Cr of 3.77). Liver Function Tests:  Recent Labs Lab 05/11/13 1950 05/12/13 0235 05/13/13 0150 05/13/13 0400  AST 28  --  29  --   ALT 23  --  23  --   ALKPHOS 88  --  83  --   BILITOT 0.4  --  0.6  --   PROT 7.7  --  7.3  --   ALBUMIN 3.5 3.3* 3.3* 3.4*   CBC:  Recent Labs Lab 05/11/13 1950 05/12/13 0235 05/13/13 0150  WBC 9.5 8.9 12.2*  NEUTROABS 7.0  --   --   HGB 11.7* 10.8* 11.3*  HCT 34.5* 32.5* 33.2*  MCV 94.0 94.8 95.1  PLT 224 199 182   Cardiac Enzymes:  Recent Labs Lab 05/12/13 0235 05/12/13 0800 05/12/13 1305  TROPONINI <0.30 <0.30 <0.30   Microbiology Recent Results (from the past 240 hour(s))  MRSA PCR SCREENING     Status: None   Collection Time    05/12/13  4:59 PM      Result Value Range Status   MRSA by PCR NEGATIVE  NEGATIVE Final   Comment:            The GeneXpert MRSA Assay (FDA     approved for NASAL specimens     only), is one component of a     comprehensive MRSA colonization     surveillance program. It is not     intended to diagnose MRSA     infection nor to guide or     monitor treatment for     MRSA infections.     Procedures and Diagnostic Studies: Dg Chest 2 View  05/11/2013   CLINICAL DATA:  Congestion  EXAM: CHEST  2 VIEW  COMPARISON:  03/21/2013  FINDINGS: There is bilateral interstitial thickening with patchy areas of alveolar airspace opacities. There are bilateral small  pleural effusions. Stable cardiomediastinal silhouette. There is evidence of prior CABG. There is prominence of the central pulmonary vasculature.  The osseous structures are unremarkable.  IMPRESSION: Overall findings are most concerning for pulmonary edema.   Electronically Signed   By: Elige Ko   On: 05/11/2013 20:40   Ct Head Wo Contrast  05/11/2013   CLINICAL DATA:  Fatigue.  EXAM: CT HEAD WITHOUT CONTRAST  TECHNIQUE: Contiguous axial images were obtained from the base of the skull through the vertex without intravenous contrast.  COMPARISON:  None.  FINDINGS: Periventricular white matter and corona radiata hypodensities favor chronic ischemic microvascular white matter disease. Hypodensity  in the left parietal lobe is somewhat confluent and favors a remote watershed infarct. I doubt that this is late subacute with regard to chronicity.  No intracranial hemorrhage or characteristic findings of acute infarct. No mass lesion observed. There is dense atherosclerotic calcification in the carotid siphons.  IMPRESSION: 1. Chronic microvascular disease, with suspected old left watershed infarct. No acute intracranial findings observed.   Electronically Signed   By: Herbie Baltimore M.D.   On: 05/11/2013 20:40   Dg Chest 2 View  05/11/2013   CLINICAL DATA:  Congestion  EXAM: CHEST  2 VIEW  COMPARISON:  03/21/2013  FINDINGS: There is bilateral interstitial thickening with patchy areas of alveolar airspace opacities. There are bilateral small pleural effusions. Stable cardiomediastinal silhouette. There is evidence of prior CABG. There is prominence of the central pulmonary vasculature.  The osseous structures are unremarkable.  IMPRESSION: Overall findings are most concerning for pulmonary edema.   Electronically Signed   By: Elige Ko   On: 05/11/2013 20:40   Ct Head Wo Contrast  05/13/2013   CLINICAL DATA:  Headache with nausea  EXAM: CT HEAD WITHOUT CONTRAST  TECHNIQUE: Contiguous axial images were  obtained from the base of the skull through the vertex without intravenous contrast.  COMPARISON:  Prior CT from 05/11/2013  FINDINGS: Extensive age-related cerebral atrophy with chronic microvascular ischemic disease again seen common stable as compared to prior study. Encephalomalacia within the left parietal lobe again noted, consistent with remote infarct. No new large vessel territory infarct identified. There is no intracranial hemorrhage. No mass lesion or midline shift. No extra-axial fluid collection. Ventricles are normal in size without evidence of hydrocephalus.  Prominent vascular calcifications noted within the distal vertebral arteries as well as the carotid siphons.  Calvarium is intact. Orbits are normal. Few scattered opacities noted within the frontal sinuses bilaterally, unchanged. Paranasal sinuses are otherwise clear. No mastoid effusion.  IMPRESSION: 1. No acute intracranial process. 2. Stable atrophy with chronic microvascular ischemic disease with remote left parietal infarct.   Electronically Signed   By: Rise Mu M.D.   On: 05/13/2013 07:02    Scheduled Meds: . amLODipine  5 mg Oral BID  . antiseptic oral rinse  15 mL Mouth Rinse q12n4p  . aspirin EC  81 mg Oral Daily  . chlorhexidine  15 mL Mouth Rinse BID  . docusate sodium  100 mg Oral BID  . enoxaparin (LOVENOX) injection  30 mg Subcutaneous Q24H  . furosemide  120 mg Intravenous Q8H  . labetalol  200 mg Oral BID  . levothyroxine  112 mcg Oral QAC breakfast  . sertraline  50 mg Oral Daily  . sodium chloride  3 mL Intravenous Q12H   Continuous Infusions:    Time spent: 35 minutes.  The patient is critically ill and requires high complexity decision making, coordination of care with nephrologist.   LOS: 2 days   Neftali Abair  [facility] Pager [phone].   *Please note that the hospitalists switch teams on Wednesdays. Please call the flow manager at [phone] if you are having difficulty  reaching the hospitalist taking care of this patient as she can update you and provide the most up-to-date pager number of provider caring for the patient. If 8PM-8AM, please contact night-coverage at [email or web address], password [facility]  05/13/2013, 7:16 AM

## 2013-05-14 DIAGNOSIS — D638 Anemia in other chronic diseases classified elsewhere: Secondary | ICD-10-CM

## 2013-05-14 DIAGNOSIS — I2510 Atherosclerotic heart disease of native coronary artery without angina pectoris: Secondary | ICD-10-CM

## 2013-05-14 LAB — PARATHYROID HORMONE, INTACT (NO CA): PTH: 293.2 pg/mL — AB (ref 14.0–72.0)

## 2013-05-14 MED ORDER — HEPARIN SODIUM (PORCINE) 5000 UNIT/ML IJ SOLN
5000.0000 [IU] | Freq: Three times a day (TID) | INTRAMUSCULAR | Status: DC
  Administered 2013-05-15 – 2013-05-19 (×10): 5000 [IU] via SUBCUTANEOUS
  Filled 2013-05-14 (×16): qty 1

## 2013-05-14 MED ORDER — HEPARIN SODIUM (PORCINE) 5000 UNIT/ML IJ SOLN
5000.0000 [IU] | Freq: Three times a day (TID) | INTRAMUSCULAR | Status: DC
Start: 2013-05-14 — End: 2013-05-14
  Filled 2013-05-14 (×3): qty 1

## 2013-05-14 MED ORDER — AMLODIPINE BESYLATE 5 MG PO TABS
5.0000 mg | ORAL_TABLET | Freq: Every day | ORAL | Status: DC
  Administered 2013-05-15 – 2013-05-19 (×5): 5 mg via ORAL
  Filled 2013-05-14 (×5): qty 1

## 2013-05-14 MED ORDER — RENA-VITE PO TABS
1.0000 | ORAL_TABLET | Freq: Every day | ORAL | Status: DC
Start: 2013-05-14 — End: 2013-05-19
  Administered 2013-05-14 – 2013-05-18 (×5): 1 via ORAL
  Filled 2013-05-14 (×6): qty 1

## 2013-05-14 NOTE — Evaluation (Signed)
Physical Therapy Evaluation Patient Details Name: [name] MRN: [id number] DOB: 04-Oct-1920 Today's Date: 05/14/2013 Time: 1420-1440 PT Time Calculation (min): 20 min  PT Assessment / Plan / Recommendation History of Present Illness  This patient comes in today with increasing confusion and fatigue. Although the patient himself thinks that he feels okay, just a little bit tired, his family members feel that he's been much more confused for the last couple of days. He has a history of chronic kidney disease and actually had an AV fistula put in this past year. He has not started dialysis yet. In the emergency room this evening he was found to have a BUN of 77 and a creatinine of 3.63. The last laboratory we have was in June of 2013 and at that time BUN was 35 and creatinine was 2.37. The patient does have a history of stroke and he says he does not have any new focal deficits although his family feels that he is having a little bit harder time getting words out than usual. He does admit to being more with "wobbly" than usual  Clinical Impression  Pt with significant weakness and decreased activity tolerance, will benefit from acute PT to address deficits and increase functional independence.  At this point PT recommends SNF due to need for 24 hour physical assistance.     PT Assessment  Patient needs continued PT services    Follow Up Recommendations  SNF (pt will need 24 hour physical assistance at this time)    Does the patient have the potential to tolerate intense rehabilitation      Barriers to Discharge Decreased caregiver support      Equipment Recommendations   (TBD)    Recommendations for Other Services     Frequency Min 3X/week    Precautions / Restrictions Precautions Precautions: Fall Restrictions Weight Bearing Restrictions: No   Pertinent Vitals/Pain No c/o pain      Mobility  Bed Mobility Bed Mobility: Supine to Sit;Sit to Supine Supine to Sit: 4: Min  assist;With rails;HOB elevated Sit to Supine: 4: Min assist;HOB flat Details for Bed Mobility Assistance: assist for positioning in bed, lifting assist at trunk for supine to sit Transfers Transfers: Sit to Stand;Stand to Sit Sit to Stand: 4: Min assist Stand to Sit: 4: Min assist Details for Transfer Assistance: pt able to stand 3 x < 10 seconds due to LE weakness.  pt states he feels like he will "give out".  Pt spO2 88% after activity on 2L O2, returned to 96% with seated rest Ambulation/Gait Ambulation/Gait Assistance: 3: Mod assist Ambulation Distance (Feet): 5 Feet Ambulation/Gait Assistance Details: Pt able to take few steps forward and backward with mod HHA, pt with posterior LOB    Exercises     PT Diagnosis: Difficulty walking;Generalized weakness  PT Problem List: Decreased strength;Decreased mobility;Decreased safety awareness;Decreased knowledge of use of DME;Decreased balance;Decreased activity tolerance;Decreased cognition;Cardiopulmonary status limiting activity;Decreased coordination PT Treatment Interventions: DME instruction;Therapeutic exercise;Wheelchair mobility training;Gait training;Balance training;Stair training;Neuromuscular re-education;Modalities;Functional mobility training;Cognitive remediation;Therapeutic activities;Patient/family education     PT Goals(Current goals can be found in the care plan section) Acute Rehab PT Goals Patient Stated Goal: none stated PT Goal Formulation: Patient unable to participate in goal setting Time For Goal Achievement: 05/28/13 Potential to Achieve Goals: Good  Visit Information  Last PT Received On: 05/14/13 Assistance Needed: +1 History of Present Illness: This patient comes in today with increasing confusion and fatigue. Although the patient himself thinks that he feels okay, just  a little bit tired, his family members feel that he's been much more confused for the last couple of days. He has a history of chronic kidney  disease and actually had an AV fistula put in this past year. He has not started dialysis yet. In the emergency room this evening he was found to have a BUN of 77 and a creatinine of 3.63. The last laboratory we have was in June of 2013 and at that time BUN was 35 and creatinine was 2.37. The patient does have a history of stroke and he says he does not have any new focal deficits although his family feels that he is having a little bit harder time getting words out than usual. He does admit to being more with "wobbly" than usual       Prior Functioning  Home Living Family/patient expects to be discharged to:: Private residence Living Arrangements: Spouse/significant other Available Help at Discharge: Family Type of Home: House Additional Comments: pt is a poor historian. he states he uses a cane "sometimes" and that he still drives.  he was unable to give home layout information on PT eval Prior Function Level of Independence: Independent with assistive device(s) Communication Communication: HOH    Cognition  Cognition Arousal/Alertness: Awake/alert Behavior During Therapy: WFL for tasks assessed/performed Overall Cognitive Status: No family/caregiver present to determine baseline cognitive functioning (difficulty with word finding and memory)    Extremity/Trunk Assessment Lower Extremity Assessment Lower Extremity Assessment: Generalized weakness Cervical / Trunk Assessment Cervical / Trunk Assessment: Normal   Balance    End of Session PT - End of Session Equipment Utilized During Treatment: Gait belt;Oxygen Activity Tolerance: Patient limited by fatigue Patient left: in bed;with call bell/phone within reach Nurse Communication: Mobility status  GP     Josilynn Losh 05/14/2013, 3:17 PM

## 2013-05-14 NOTE — Procedures (Signed)
Pt seen on HD.  Ap 210 Vp 140.  Pt lethargic now as BP recently decreased.  Able to stick AVF though with a little difficulty.  Will take out of UF.  Will  Decrease amlodipine.

## 2013-05-14 NOTE — Progress Notes (Signed)
PATIENT DETAILS Name: [name] Age: 80 y.o. Sex: male Date of Birth: 16-Jul-1920 Admit Date: 05/11/2013 Admitting Physician Acey LavAllison L Wood, MD UJW:[id number]:Default, Provider, MD  Subjective: Mildly confused, but very pleasant. And not in distress. Answers almost all my questions appropriately.  Assessment/Plan: Principal Problem:   Metabolic encephalopathy - Suspected secondary to uremia, better post dialysis. - CT of the head on 05/11/13 and 05/13/48 negative for acute abnormalities.  MRI brain was ordered, however was not done because of patient's hemodynamic instability, do not think it is necessary at this point. - We'll continue to follow closely and monitor clinical progress  Acute respiratory failure- hypoxic - Secondary to pulmonary edema in a setting of worsening kidney function. Initially required BiPAP, however now on nasal cannula. Was seen by nephrology, initially started on high-dose intravenous Lasix, minimal improvement with that, however with worsening encephalopathy, patient underwent dialysis on 05/14/12. - Currently just on nasal cannula  Acute on chronic CHF - Diuresis with hemodialysis. - ACE inhibitor on hold-due to worsening renal function, if deemed ESRD will resume. - Echo on 05/13/13 showed EF around 35-40%  Acute on chronic kidney disease stage IV - Given acute pulmonary edema, metabolic encephalopathy from suspected uremia-patient underwent hemodialysis on 05/14/13. - Nephrology following, not sure if patient is now ESRD. Would await nephrology followup.  Hypertension - BP soft post dialysis. Cautiously continue labetalol.  Hypothyroidism  -Continue Synthroid.  Anemia of chronic disease  - Secondary to chronic kidney disease -Hemoglobin stable with no indication for transfusion.  Depression  Continue Zoloft.  Secondary hyperparathyroidism - Defer to nephrology  CAD (coronary artery disease)  -Patient is status post CABG. Troponins negative.  Continue aspirin.  Disposition: Remain inpatient  DVT Prophylaxis: Prophylactic Heparin  Code Status: Full code   Family Communication None at bedside  Procedures:  HD 1/5  CONSULTS:  nephrology  Time spent 40 minutes-which includes 50% of the time with face-to-face with patient and explaining current plans   MEDICATIONS: Scheduled Meds: . [START ON 05/15/2013] amLODipine  5 mg Oral Daily  . antiseptic oral rinse  15 mL Mouth Rinse q12n4p  . aspirin EC  81 mg Oral Daily  . chlorhexidine  15 mL Mouth Rinse BID  . docusate sodium  100 mg Oral BID  . enoxaparin (LOVENOX) injection  30 mg Subcutaneous Q24H  . labetalol  200 mg Oral BID  . levothyroxine  112 mcg Oral QAC breakfast  . multivitamin  1 tablet Oral QHS  . sertraline  50 mg Oral Daily  . sodium chloride  3 mL Intravenous Q12H   Continuous Infusions:  PRN Meds:.acetaminophen, acetaminophen, hydrALAZINE, morphine injection, ondansetron (ZOFRAN) IV  Antibiotics: Anti-infectives   None       PHYSICAL EXAM: Vital signs in last 24 hours: Filed Vitals:   05/14/13 0930 05/14/13 0947 05/14/13 1045 05/14/13 1251  BP: 94/67 112/56 118/56 126/66  Pulse: 69 57 61 63  Temp:  94.4 F (34.7 C) 97.3 F (36.3 C)   TempSrc:  Axillary    Resp:  17 16 17   Height:      Weight:  64.4 kg (141 lb 15.6 oz)    SpO2:  99% 97% 96%    Weight change: 0.049 kg (1.7 oz) Filed Weights   05/13/13 2024 05/14/13 0655 05/14/13 0947  Weight: 67.949 kg (149 lb 12.8 oz) 65.6 kg (144 lb 10 oz) 64.4 kg (141 lb 15.6 oz)   Body mass index is 20.37 kg/(m^2).   Gen Exam: Awake,  mild confusion   Neck: Supple, No JVD.   Chest: B/L Clear.   CVS: S1 S2 Regular, no murmurs.  Abdomen: soft, BS +, non tender, non distended.  Extremities: no edema, lower extremities warm to touch. Neurologic: Non Focal.   Skin: No Rash.   Wounds: N/A.    Intake/Output from previous day:  Intake/Output Summary (Last 24 hours) at 05/14/13 1305 Last  data filed at 05/14/13 0947  Gross per 24 hour  Intake     40 ml  Output   1726 ml  Net  -1686 ml     LAB RESULTS: CBC  Recent Labs Lab 05/11/13 1950 05/12/13 0235 05/13/13 0150  WBC 9.5 8.9 12.2*  HGB 11.7* 10.8* 11.3*  HCT 34.5* 32.5* 33.2*  PLT 224 199 182  MCV 94.0 94.8 95.1  MCH 31.9 31.5 32.4  MCHC 33.9 33.2 34.0  RDW 14.2 14.2 14.3  LYMPHSABS 1.4  --   --   MONOABS 0.8  --   --   EOSABS 0.3  --   --   BASOSABS 0.0  --   --     Chemistries   Recent Labs Lab 05/11/13 1950 05/12/13 0235 05/13/13 0150 05/13/13 0400  NA 137 139 139 141  K 4.6 4.5 4.8 4.8  CL 104 105 103 103  CO2 16* 18* 18* 19  GLUCOSE 150* 112* 186* 161*  BUN 77* 75* 76* 79*  CREATININE 3.63* 3.49* 3.67* 3.77*  CALCIUM 9.1 8.7 8.7 8.7  MG  --  1.9  --   --     CBG: No results found for this basename: GLUCAP,  in the last 168 hours  GFR Estimated Creatinine Clearance: 11.9 ml/min (by C-G formula based on Cr of 3.77).  Coagulation profile No results found for this basename: INR, PROTIME,  in the last 168 hours  Cardiac Enzymes  Recent Labs Lab 05/12/13 0235 05/12/13 0800 05/12/13 1305  TROPONINI <0.30 <0.30 <0.30    No components found with this basename: POCBNP,  No results found for this basename: DDIMER,  in the last 72 hours  Recent Labs  05/12/13 0235  HGBA1C 5.7*   No results found for this basename: CHOL, HDL, LDLCALC, TRIG, CHOLHDL, LDLDIRECT,  in the last 72 hours  Recent Labs  05/12/13 0235  TSH 13.005*    Recent Labs  05/13/13 0400  FERRITIN 46  TIBC 312  IRON 28*   No results found for this basename: LIPASE, AMYLASE,  in the last 72 hours  Urine Studies No results found for this basename: UACOL, UAPR, USPG, UPH, UTP, UGL, UKET, UBIL, UHGB, UNIT, UROB, ULEU, UEPI, UWBC, URBC, UBAC, CAST, CRYS, UCOM, BILUA,  in the last 72 hours  MICROBIOLOGY: Recent Results (from the past 240 hour(s))  URINE CULTURE     Status: None   Collection Time     05/12/13  2:34 AM      Result Value Range Status   Specimen Description URINE, RANDOM   Final   Special Requests A   Final   Culture  Setup Time     Final   Value: 05/12/2013 16:21     Performed at [facility] Count     Final   Value: NO GROWTH     Performed at [facility]   Culture     Final   Value: NO GROWTH     Performed at [facility]   Report Status 05/13/2013 FINAL   Final  MRSA PCR SCREENING     Status: None   Collection Time    05/12/13  4:59 PM      Result Value Range Status   MRSA by PCR NEGATIVE  NEGATIVE Final   Comment:            The GeneXpert MRSA Assay (FDA     approved for NASAL specimens     only), is one component of a     comprehensive MRSA colonization     surveillance program. It is not     intended to diagnose MRSA     infection nor to guide or     monitor treatment for     MRSA infections.    RADIOLOGY STUDIES/RESULTS: Dg Chest 2 View  05/11/2013   CLINICAL DATA:  Congestion  EXAM: CHEST  2 VIEW  COMPARISON:  03/21/2013  FINDINGS: There is bilateral interstitial thickening with patchy areas of alveolar airspace opacities. There are bilateral small pleural effusions. Stable cardiomediastinal silhouette. There is evidence of prior CABG. There is prominence of the central pulmonary vasculature.  The osseous structures are unremarkable.  IMPRESSION: Overall findings are most concerning for pulmonary edema.   Electronically Signed   By: Elige Ko   On: 05/11/2013 20:40   Ct Head Wo Contrast  05/13/2013   CLINICAL DATA:  Headache with nausea  EXAM: CT HEAD WITHOUT CONTRAST  TECHNIQUE: Contiguous axial images were obtained from the base of the skull through the vertex without intravenous contrast.  COMPARISON:  Prior CT from 05/11/2013  FINDINGS: Extensive age-related cerebral atrophy with chronic microvascular ischemic disease again seen common stable as compared to prior study. Encephalomalacia within the left parietal lobe  again noted, consistent with remote infarct. No new large vessel territory infarct identified. There is no intracranial hemorrhage. No mass lesion or midline shift. No extra-axial fluid collection. Ventricles are normal in size without evidence of hydrocephalus.  Prominent vascular calcifications noted within the distal vertebral arteries as well as the carotid siphons.  Calvarium is intact. Orbits are normal. Few scattered opacities noted within the frontal sinuses bilaterally, unchanged. Paranasal sinuses are otherwise clear. No mastoid effusion.  IMPRESSION: 1. No acute intracranial process. 2. Stable atrophy with chronic microvascular ischemic disease with remote left parietal infarct.   Electronically Signed   By: Rise Mu M.D.   On: 05/13/2013 07:02   Ct Head Wo Contrast  05/11/2013   CLINICAL DATA:  Fatigue.  EXAM: CT HEAD WITHOUT CONTRAST  TECHNIQUE: Contiguous axial images were obtained from the base of the skull through the vertex without intravenous contrast.  COMPARISON:  None.  FINDINGS: Periventricular white matter and corona radiata hypodensities favor chronic ischemic microvascular white matter disease. Hypodensity in the left parietal lobe is somewhat confluent and favors a remote watershed infarct. I doubt that this is late subacute with regard to chronicity.  No intracranial hemorrhage or characteristic findings of acute infarct. No mass lesion observed. There is dense atherosclerotic calcification in the carotid siphons.  IMPRESSION: 1. Chronic microvascular disease, with suspected old left watershed infarct. No acute intracranial findings observed.   Electronically Signed   By: Herbie Baltimore M.D.   On: 05/11/2013 20:40   Dg Chest Port 1 View  05/13/2013   CLINICAL DATA:  Shortness of breath.  Follow-up edema.  EXAM: PORTABLE CHEST - 1 VIEW  COMPARISON:  05/11/2013 and 03/21/2013.  FINDINGS: 0733 hr. The heart size and mediastinal contours are stable status post CABG. There is  increased diffuse interstitial  prominence with patchy left greater than right basilar airspace opacities. In addition, there is peripheral density in the upper right hemithorax which is new from 2 months ago. This may reflect partially loculated pleural fluid. No pneumothorax is identified.  IMPRESSION: Increased interstitial and basilar pulmonary opacities suspicious for worsening edema. There is possible partially loculated pleural fluid on the right.   Electronically Signed   By: Roxy Horseman M.D.   On: 05/13/2013 10:28    Jeoffrey Massed, MD  [facility] Pager:[phone]  If 7PM-7AM, please contact night-coverage [email or web address] Password [email or web address] 05/14/2013, 1:05 PM   LOS: 3 days

## 2013-05-15 DIAGNOSIS — I5043 Acute on chronic combined systolic (congestive) and diastolic (congestive) heart failure: Secondary | ICD-10-CM

## 2013-05-15 DIAGNOSIS — I509 Heart failure, unspecified: Secondary | ICD-10-CM

## 2013-05-15 LAB — BASIC METABOLIC PANEL
BUN: 77 mg/dL — ABNORMAL HIGH (ref 6–23)
CALCIUM: 8.1 mg/dL — AB (ref 8.4–10.5)
CO2: 26 meq/L (ref 19–32)
CREATININE: 5.54 mg/dL — AB (ref 0.50–1.35)
Chloride: 97 mEq/L (ref 96–112)
GFR, EST AFRICAN AMERICAN: 9 mL/min — AB (ref >90)
GFR, EST NON AFRICAN AMERICAN: 8 mL/min — AB (ref >90)
Glucose, Bld: 100 mg/dL — ABNORMAL HIGH (ref 70–99)
Potassium: 4.2 mEq/L (ref 3.7–5.3)
SODIUM: 140 meq/L (ref 137–147)

## 2013-05-15 LAB — VITAMIN D 25 HYDROXY (VIT D DEFICIENCY, FRACTURES): VIT D 25 HYDROXY: 34 ng/mL (ref 30–89)

## 2013-05-15 MED ORDER — FUROSEMIDE 80 MG PO TABS
160.0000 mg | ORAL_TABLET | Freq: Two times a day (BID) | ORAL | Status: DC
Start: 2013-05-15 — End: 2013-05-16
  Administered 2013-05-15: 160 mg via ORAL
  Filled 2013-05-15 (×4): qty 2

## 2013-05-15 NOTE — Clinical Social Work Note (Signed)
CSW completed assessment (full assessment to follow) with son and wife regarding SNF placement for patient and they are in agreement. CSW will initiate SNF search and provide family with bed offers on 1/7.  Genelle BalVanessa Aidenjames Heckmann, MSW, LCSW [phone]

## 2013-05-15 NOTE — Progress Notes (Signed)
S: No N/V  Appetite marginal.  Much more alert O:BP 136/52  Pulse 61  Temp(Src) 97.9 F (36.6 C) (Oral)  Resp 17  Ht 5\' 10"  (1.778 m)  Wt 64.864 kg (143 lb)  BMI 20.52 kg/m2  SpO2 97%  Intake/Output Summary (Last 24 hours) at 05/15/13 1211 Last data filed at 05/14/13 1828  Gross per 24 hour  Intake    120 ml  Output      0 ml  Net    120 ml   Weight change: -3.549 kg (-7 lb 13.2 oz) WUJ:WJXBJGen:awake and alert CVS: Resp:bil crackles Abd:+ BS NTND Ext: No edema   Rt UA AVF + bruit NEURO:CNI Ox3 no asterixis   . amLODipine  5 mg Oral Daily  . antiseptic oral rinse  15 mL Mouth Rinse q12n4p  . aspirin EC  81 mg Oral Daily  . chlorhexidine  15 mL Mouth Rinse BID  . docusate sodium  100 mg Oral BID  . heparin subcutaneous  5,000 Units Subcutaneous Q8H  . labetalol  200 mg Oral BID  . levothyroxine  112 mcg Oral QAC breakfast  . multivitamin  1 tablet Oral QHS  . sertraline  50 mg Oral Daily  . sodium chloride  3 mL Intravenous Q12H   No results found. BMET    Component Value Date/Time   NA 141 05/13/2013 0400   K 4.8 05/13/2013 0400   CL 103 05/13/2013 0400   CO2 19 05/13/2013 0400   GLUCOSE 161* 05/13/2013 0400   BUN 79* 05/13/2013 0400   CREATININE 3.77* 05/13/2013 0400   CALCIUM 8.7 05/13/2013 0400   GFRNONAA 13* 05/13/2013 0400   GFRAA 15* 05/13/2013 0400   CBC    Component Value Date/Time   WBC 12.2* 05/13/2013 0150   RBC 3.49* 05/13/2013 0150   HGB 11.3* 05/13/2013 0150   HCT 33.2* 05/13/2013 0150   PLT 182 05/13/2013 0150   MCV 95.1 05/13/2013 0150   MCH 32.4 05/13/2013 0150   MCHC 34.0 05/13/2013 0150   RDW 14.3 05/13/2013 0150   LYMPHSABS 1.4 05/11/2013 1950   MONOABS 0.8 05/11/2013 1950   EOSABS 0.3 05/11/2013 1950   BASOSABS 0.0 05/11/2013 1950     Assessment: 1. Acute on CKD4 2. Anemia 3. HTN 4. Pulm edema 5. Sec HPTH  Plan: 1. Will check labs now and again in AM.  If Scr increasing then will do HD again in AM. 2. Start Lasix.  If Scr increasing significantly from today to tomorrow  and UO marginal, then will plan HD and call him ESRD   Laurice Iglesia T

## 2013-05-15 NOTE — Progress Notes (Signed)
PATIENT DETAILS Name: [name] Age: 80 y.o. Sex: male Date of Birth: 05/26/1920 Admit Date: 05/11/2013 Admitting Physician Acey Lav, MD ZOX:[id number], Provider, MD  Subjective: Mentation much better. Answers all questions appropriately  Assessment/Plan: Principal Problem:   Metabolic encephalopathy - Suspected secondary to uremia, better post dialysis.Significantly improved - CT of the head on 05/11/13 and 05/13/48 negative for acute abnormalities.  MRI brain was ordered, however was not done because of patient's hemodynamic instability, do not think it is necessary at this point. - We'll continue to follow closely and monitor clinical progress  Acute respiratory failure- hypoxic - Secondary to pulmonary edema in a setting of worsening kidney function. Initially required BiPAP, however now on nasal cannula. Was seen by nephrology, initially started on high-dose intravenous Lasix, minimal improvement with that, however with worsening encephalopathy, patient underwent dialysis on 05/14/12. - Currently just on nasal cannula  Acute on chronic CHF - Diuresis with hemodialysis. - ACE inhibitor on hold-due to worsening renal function, if deemed ESRD will resume. - Echo on 05/13/13 showed EF around 35-40%  Acute on chronic kidney disease stage IV - Given acute pulmonary edema, metabolic encephalopathy from suspected uremia-patient underwent hemodialysis on 05/14/13. - Nephrology following, not sure if patient is now ESRD. Would await nephrology followup.  Hypertension - BP soft post dialysis. Controlled,continue with labetalol and amlodipine  Hypothyroidism  -Continue Synthroid.  Anemia of chronic disease  - Secondary to chronic kidney disease -Hemoglobin stable with no indication for transfusion.  Depression  Continue Zoloft.  Secondary hyperparathyroidism - Defer to nephrology  CAD (coronary artery disease)  -Patient is status post CABG. Troponins negative. Continue  aspirin.  Disposition: Remain inpatient-suspect will need SNF on discharge  DVT Prophylaxis: Prophylactic Heparin  Code Status: Full code   Family Communication Son-Bradford over the phone 1/6  Procedures:  HD 1/5  CONSULTS:  nephrology   MEDICATIONS: Scheduled Meds: . amLODipine  5 mg Oral Daily  . antiseptic oral rinse  15 mL Mouth Rinse q12n4p  . aspirin EC  81 mg Oral Daily  . chlorhexidine  15 mL Mouth Rinse BID  . docusate sodium  100 mg Oral BID  . furosemide  160 mg Oral BID  . heparin subcutaneous  5,000 Units Subcutaneous Q8H  . labetalol  200 mg Oral BID  . levothyroxine  112 mcg Oral QAC breakfast  . multivitamin  1 tablet Oral QHS  . sertraline  50 mg Oral Daily  . sodium chloride  3 mL Intravenous Q12H   Continuous Infusions:  PRN Meds:.acetaminophen, acetaminophen, hydrALAZINE, morphine injection, ondansetron (ZOFRAN) IV  Antibiotics: Anti-infectives   None       PHYSICAL EXAM: Vital signs in last 24 hours: Filed Vitals:   05/14/13 2210 05/15/13 0608 05/15/13 0958 05/15/13 1236  BP: 129/63 138/61 136/52 122/60  Pulse: 63 59 61 58  Temp:  97.7 F (36.5 C) 97.9 F (36.6 C) 97.2 F (36.2 C)  TempSrc:  Oral    Resp:  16 17 16   Height:      Weight:      SpO2:  98% 97% 97%    Weight change: -3.549 kg (-7 lb 13.2 oz) Filed Weights   05/14/13 0655 05/14/13 0947 05/14/13 2028  Weight: 65.6 kg (144 lb 10 oz) 64.4 kg (141 lb 15.6 oz) 64.864 kg (143 lb)   Body mass index is 20.52 kg/(m^2).   Gen Exam: Awake, and mostly alert Neck: Supple, No JVD.   Chest: few bibasilar rales CVS:  S1 S2 Regular, no murmurs.  Abdomen: soft, BS +, non tender, non distended.  Extremities: no edema, lower extremities warm to touch. Neurologic: Non Focal.   Skin: No Rash.   Wounds: N/A.    Intake/Output from previous day:  Intake/Output Summary (Last 24 hours) at 05/15/13 1303 Last data filed at 05/14/13 1828  Gross per 24 hour  Intake    120 ml   Output      0 ml  Net    120 ml     LAB RESULTS: CBC  Recent Labs Lab 05/11/13 1950 05/12/13 0235 05/13/13 0150  WBC 9.5 8.9 12.2*  HGB 11.7* 10.8* 11.3*  HCT 34.5* 32.5* 33.2*  PLT 224 199 182  MCV 94.0 94.8 95.1  MCH 31.9 31.5 32.4  MCHC 33.9 33.2 34.0  RDW 14.2 14.2 14.3  LYMPHSABS 1.4  --   --   MONOABS 0.8  --   --   EOSABS 0.3  --   --   BASOSABS 0.0  --   --     Chemistries   Recent Labs Lab 05/11/13 1950 05/12/13 0235 05/13/13 0150 05/13/13 0400  NA 137 139 139 141  K 4.6 4.5 4.8 4.8  CL 104 105 103 103  CO2 16* 18* 18* 19  GLUCOSE 150* 112* 186* 161*  BUN 77* 75* 76* 79*  CREATININE 3.63* 3.49* 3.67* 3.77*  CALCIUM 9.1 8.7 8.7 8.7  MG  --  1.9  --   --     CBG: No results found for this basename: GLUCAP,  in the last 168 hours  GFR Estimated Creatinine Clearance: 12 ml/min (by C-G formula based on Cr of 3.77).  Coagulation profile No results found for this basename: INR, PROTIME,  in the last 168 hours  Cardiac Enzymes  Recent Labs Lab 05/12/13 0235 05/12/13 0800 05/12/13 1305  TROPONINI <0.30 <0.30 <0.30    No components found with this basename: POCBNP,  No results found for this basename: DDIMER,  in the last 72 hours No results found for this basename: HGBA1C,  in the last 72 hours No results found for this basename: CHOL, HDL, LDLCALC, TRIG, CHOLHDL, LDLDIRECT,  in the last 72 hours No results found for this basename: TSH, T4TOTAL, FREET3, T3FREE, THYROIDAB,  in the last 72 hours  Recent Labs  05/13/13 0400  FERRITIN 46  TIBC 312  IRON 28*   No results found for this basename: LIPASE, AMYLASE,  in the last 72 hours  Urine Studies No results found for this basename: UACOL, UAPR, USPG, UPH, UTP, UGL, UKET, UBIL, UHGB, UNIT, UROB, ULEU, UEPI, UWBC, URBC, UBAC, CAST, CRYS, UCOM, BILUA,  in the last 72 hours  MICROBIOLOGY: Recent Results (from the past 240 hour(s))  URINE CULTURE     Status: None   Collection Time     05/12/13  2:34 AM      Result Value Range Status   Specimen Description URINE, RANDOM   Final   Special Requests A   Final   Culture  Setup Time     Final   Value: 05/12/2013 16:21     Performed at [facility] Count     Final   Value: NO GROWTH     Performed at [facility]   Culture     Final   Value: NO GROWTH     Performed at [facility]   Report Status 05/13/2013 FINAL   Final  MRSA PCR SCREENING  Status: None   Collection Time    05/12/13  4:59 PM      Result Value Range Status   MRSA by PCR NEGATIVE  NEGATIVE Final   Comment:            The GeneXpert MRSA Assay (FDA     approved for NASAL specimens     only), is one component of a     comprehensive MRSA colonization     surveillance program. It is not     intended to diagnose MRSA     infection nor to guide or     monitor treatment for     MRSA infections.    RADIOLOGY STUDIES/RESULTS: Dg Chest 2 View  05/11/2013   CLINICAL DATA:  Congestion  EXAM: CHEST  2 VIEW  COMPARISON:  03/21/2013  FINDINGS: There is bilateral interstitial thickening with patchy areas of alveolar airspace opacities. There are bilateral small pleural effusions. Stable cardiomediastinal silhouette. There is evidence of prior CABG. There is prominence of the central pulmonary vasculature.  The osseous structures are unremarkable.  IMPRESSION: Overall findings are most concerning for pulmonary edema.   Electronically Signed   By: Elige KoHetal  Patel   On: 05/11/2013 20:40   Ct Head Wo Contrast  05/13/2013   CLINICAL DATA:  Headache with nausea  EXAM: CT HEAD WITHOUT CONTRAST  TECHNIQUE: Contiguous axial images were obtained from the base of the skull through the vertex without intravenous contrast.  COMPARISON:  Prior CT from 05/11/2013  FINDINGS: Extensive age-related cerebral atrophy with chronic microvascular ischemic disease again seen common stable as compared to prior study. Encephalomalacia within the left parietal lobe  again noted, consistent with remote infarct. No new large vessel territory infarct identified. There is no intracranial hemorrhage. No mass lesion or midline shift. No extra-axial fluid collection. Ventricles are normal in size without evidence of hydrocephalus.  Prominent vascular calcifications noted within the distal vertebral arteries as well as the carotid siphons.  Calvarium is intact. Orbits are normal. Few scattered opacities noted within the frontal sinuses bilaterally, unchanged. Paranasal sinuses are otherwise clear. No mastoid effusion.  IMPRESSION: 1. No acute intracranial process. 2. Stable atrophy with chronic microvascular ischemic disease with remote left parietal infarct.   Electronically Signed   By: Rise MuBenjamin  McClintock M.D.   On: 05/13/2013 07:02   Ct Head Wo Contrast  05/11/2013   CLINICAL DATA:  Fatigue.  EXAM: CT HEAD WITHOUT CONTRAST  TECHNIQUE: Contiguous axial images were obtained from the base of the skull through the vertex without intravenous contrast.  COMPARISON:  None.  FINDINGS: Periventricular white matter and corona radiata hypodensities favor chronic ischemic microvascular white matter disease. Hypodensity in the left parietal lobe is somewhat confluent and favors a remote watershed infarct. I doubt that this is late subacute with regard to chronicity.  No intracranial hemorrhage or characteristic findings of acute infarct. No mass lesion observed. There is dense atherosclerotic calcification in the carotid siphons.  IMPRESSION: 1. Chronic microvascular disease, with suspected old left watershed infarct. No acute intracranial findings observed.   Electronically Signed   By: Herbie BaltimoreWalt  Liebkemann M.D.   On: 05/11/2013 20:40   Dg Chest Port 1 View  05/13/2013   CLINICAL DATA:  Shortness of breath.  Follow-up edema.  EXAM: PORTABLE CHEST - 1 VIEW  COMPARISON:  05/11/2013 and 03/21/2013.  FINDINGS: 0733 hr. The heart size and mediastinal contours are stable status post CABG. There is  increased diffuse interstitial prominence with patchy left greater than right  basilar airspace opacities. In addition, there is peripheral density in the upper right hemithorax which is new from 2 months ago. This may reflect partially loculated pleural fluid. No pneumothorax is identified.  IMPRESSION: Increased interstitial and basilar pulmonary opacities suspicious for worsening edema. There is possible partially loculated pleural fluid on the right.   Electronically Signed   By: Roxy Horseman M.D.   On: 05/13/2013 10:28    Jeoffrey Massed, MD  [facility] Pager:[phone]  If 7PM-7AM, please contact night-coverage [email or web address] Password [email or web address] 05/15/2013, 1:03 PM   LOS: 4 days

## 2013-05-16 LAB — RENAL FUNCTION PANEL
Albumin: 3.2 g/dL — ABNORMAL LOW (ref 3.5–5.2)
BUN: 90 mg/dL — ABNORMAL HIGH (ref 6–23)
CALCIUM: 7.9 mg/dL — AB (ref 8.4–10.5)
CO2: 22 mEq/L (ref 19–32)
Chloride: 97 mEq/L (ref 96–112)
Creatinine, Ser: 6.36 mg/dL — ABNORMAL HIGH (ref 0.50–1.35)
GFR calc Af Amer: 8 mL/min — ABNORMAL LOW (ref >90)
GFR calc non Af Amer: 7 mL/min — ABNORMAL LOW (ref >90)
GLUCOSE: 92 mg/dL (ref 70–99)
PHOSPHORUS: 7.1 mg/dL — AB (ref 2.3–4.6)
POTASSIUM: 4.3 meq/L (ref 3.7–5.3)
Sodium: 140 mEq/L (ref 137–147)

## 2013-05-16 MED ORDER — SODIUM CHLORIDE 0.9 % IV SOLN
1020.0000 mg | Freq: Once | INTRAVENOUS | Status: AC
  Administered 2013-05-17: 1020 mg via INTRAVENOUS
  Filled 2013-05-16 (×2): qty 34

## 2013-05-16 NOTE — Progress Notes (Signed)
05/16/2013 12:34 PM Hemodialysis Outpatient Note; [id number]. His schedule is Tuesday, Thursday and Saturday 2nd shift. The center Mr. Colin Bentonarle starting treatment on Tuesday January 13,2015 at 12:00pm. Please have a family member accompany patient to assist with signing consents and paperwork. Thank you.Tilman NeatKrzywonos, Zane Samson H

## 2013-05-16 NOTE — Clinical Social Work Placement (Addendum)
Clinical Social Work Department CLINICAL SOCIAL WORK PLACEMENT NOTE 05/16/2013  Patient:  Dimple NanasARLE,Cortlan W  Account Number:  [id number] Admit date:  05/11/2013  Clinical Social Worker:  Genelle BalVANESSA Bengie Kaucher, LCSW  Date/time:  05/16/2013 04:31 AM  Clinical Social Work is seeking post-discharge placement for this patient at the following level of care:   SKILLED NURSING   (*CSW will update this form in Epic as items are completed)   05/15/2013  Patient/family provided with [facility] Department of Clinical Social Work's list of facilities offering this level of care within the geographic area requested by the patient (or if unable, by the patient's family).  05/15/2013  Patient/family informed of their freedom to choose among providers that offer the needed level of care, that participate in Medicare, Medicaid or managed care program needed by the patient, have an available bed and are willing to accept the patient.    Patient/family informed of [facility]' ownership interest in [facility], as well as of the fact that they are under no obligation to receive care at this facility.  PASARR submitted to EDS on 05/16/2013 PASARR number received from EDS on 05/17/13 - [phone]  FL2 transmitted to all facilities in geographic area requested by pt/family on  05/16/2013 FL2 transmitted to all facilities within larger geographic area on   Patient informed that his/her managed care company has contracts with or will negotiate with  certain facilities, including the following:     Patient/family informed of bed offers received: 05/17/13  [facility] Physician recommends and patient chooses bed at    [facility]13 Patient to be transferred to facility by ambulance  The following physician request were entered in Epic:  Additional Comments: 05/18/13: Son contacted and advised that patient will probably be ready for d/c Saturday.  Patient will go to HD at the hospital then [facility]. His first dialysis treatment at [facility] will be Tuesday,05/22/13.

## 2013-05-16 NOTE — Clinical Social Work Psychosocial (Signed)
Clinical Social Work Department BRIEF PSYCHOSOCIAL ASSESSMENT 05/16/2013  Patient:  [name],[name]     Account Number:  [id number]     Admit date:  05/11/2013  Clinical Social Worker:  Delmer IslamRAWFORD,Bolton Canupp, LCSW  Date/Time:  05/16/2013 04:07 AM  Referred by:  Physician  Date Referred:  05/15/2013 Referred for  SNF Placement   Other Referral:   Interview type:  Patient Other interview type:   CSW talked with patient, wife Wayne SideColleen (Cy) and son [name] [phone]).    PSYCHOSOCIAL DATA Living Status:  WIFE Admitted from facility:   Level of care:   Primary support name:  [name] Primary support relationship to patient:  SPOUSE Degree of support available:   Wife and son are involved with patient and his well-being.    CURRENT CONCERNS Current Concerns  Post-Acute Placement   Other Concerns:    SOCIAL WORK ASSESSMENT / PLAN On 05/16/13, CSW informed that patient/family wanted SNF list. Upon my entrance to the room, the patient immediately help out his hands to receive SNF list. CSW talked with patient, wife "Cy" and son [name] about discharging to a SNF for rehab after his hospital stay. CSW advised by son and wife that they have both been in several facilities before for rehab, including [facility]. [facility] [place] or [place] SNF [place].   Assessment/plan status:  Psychosocial Support/Ongoing Assessment of Needs Other assessment/ plan:   Information/referral to community resources:   On 05/15/13 [facility]    PATIENT'S/FAMILY'S RESPONSE TO PLAN OF CARE: Patient and family pleasant and receptive to talking with CSW about SNF place. Patient aware that he needs ST rehab and was very engaged in the conversation.

## 2013-05-16 NOTE — Progress Notes (Signed)
Physical Therapy Treatment Patient Details Name: Wayne NanasSamuel W [name] MRN: [id number] DOB: [birth year] Today's Date: 05/16/2013 Time: [id number] PT Time Calculation (min): 26 min  PT Assessment / Plan / Recommendation  History of Present Illness This patient comes in today with increasing confusion and fatigue.  He has a history of chronic kidney disease and actually had an AV fistula put in this past year.    PT Comments   Patient able to ambulate further today and needing less assist for transfers.  Still high fall risk with posterior tendency initially and needing external support at all times.  Follow Up Recommendations  SNF;Supervision/Assistance - 24 hour     Does the patient have the potential to tolerate intense rehabilitation   N/A  Barriers to Discharge  None      Equipment Recommendations  Rolling walker with 5" wheels    Recommendations for Other Services  None  Frequency Min 3X/week   Progress towards PT Goals Progress towards PT goals: Progressing toward goals  Plan Current plan remains appropriate    Precautions / Restrictions Precautions Precautions: Fall   Pertinent Vitals/Pain Denies pain    Mobility  Bed Mobility Overal bed mobility: Needs Assistance Bed Mobility: Supine to Sit;Sit to Supine Supine to sit: Min guard Sit to supine: Min guard General bed mobility comments: cues for moving up in the bed after to supine Transfers Overall transfer level: Needs assistance Equipment used: Rolling walker (2 wheeled) Transfers: Sit to/from Stand Sit to Stand: Min assist General transfer comment: cues for hand placement Ambulation/Gait Ambulation/Gait assistance: Min assist;Mod assist Ambulation Distance (Feet): 100 Feet Assistive device: 1 person hand held assist (and held wall rail 70% of the time) Gait Pattern/deviations: Step-through pattern;Decreased stride length;Leaning posteriorly;Shuffle General Gait Details: leaning posteriorly initially, improved after  sink activities; occasionally needed greater than min assist if not using wall rail (used rail and hand hold due to no walker available)    Exercises General Exercises - Lower Extremity Hip ABduction/ADduction: AROM;Both;10 reps;Standing Hip Flexion/Marching: AROM;Both;10 reps;Standing Toe Raises: 10 reps;Both Heel Raises: AROM;Standing    PT Goals (current goals can now be found in the care plan section)    Visit Information  Last PT Received On: 05/16/13 Assistance Needed: +1 History of Present Illness: This patient comes in today with increasing confusion and fatigue.  He has a history of chronic kidney disease and actually had an AV fistula put in this past year.     Subjective Data   When do I go to dialysis?   Cognition  Cognition Arousal/Alertness: Awake/alert Behavior During Therapy: WFL for tasks assessed/performed Overall Cognitive Status: No family/caregiver present to determine baseline cognitive functioning    Balance  Balance Overall balance assessment: Needs assistance Standing balance-Leahy Scale: Poor Standing balance comment: leaning posteriorly initially, better with increased practice on his feet, stood at sink to brush teeth without UE support, but leaning on counter and still needing min assist, however at end of session  End of Session PT - End of Session Equipment Utilized During Treatment: Gait belt Activity Tolerance: Patient tolerated treatment well Patient left: in bed;with call bell/phone within reach;with bed alarm set   GP     [facility],CYNDI 05/16/2013, 1:27 PM [place] Riding, PT [phone] 05/16/2013

## 2013-05-16 NOTE — Progress Notes (Signed)
PATIENT DETAILS Name: [name] Age: 80 y.o. Sex: male Date of Birth: Apr 11, 1921 Admit Date: 05/11/2013 Admitting Physician Acey Lav, MD ZOX:WRUEAVW, Provider, MD  Subjective: Awake and alert.  Assessment/Plan: Principal Problem:   Metabolic encephalopathy - Suspected secondary to uremia, better post dialysis.Significantly improved and suspect back to baseline. - CT of the head on 05/11/13 and 05/13/48 negative for acute abnormalities.  MRI brain was ordered, however was not done because of patient's hemodynamic instability, do not think it is necessary at this point. - We'll continue to follow closely and monitor clinical progress  Acute respiratory failure- hypoxic - Secondary to pulmonary edema in a setting of worsening kidney function. Initially required BiPAP, however now on nasal cannula. Was seen by nephrology, initially started on high-dose intravenous Lasix, minimal improvement with that, however with worsening encephalopathy, patient underwent dialysis with significant improvement. - Currently just on nasal cannula  Acute on chronic CHF - Diuresis with hemodialysis. - ACE inhibitor on hold-due to worsening renal function, if deemed ESRD will resume. - Echo on 05/13/13 showed EF around 35-40%  Acute on chronic kidney disease stage IV-now suspected ESRD - Given acute pulmonary edema, metabolic encephalopathy from suspected uremia-patient underwent hemodialysis on 05/14/13. - Since not much urine output, and creatinine still elevated-suspected ESRD.  Hypertension - BP soft post dialysis. Controlled,continue with labetalol and amlodipine  Hypothyroidism  -Continue Synthroid.  Anemia of chronic disease  - Secondary to chronic kidney disease -Hemoglobin stable with no indication for transfusion.  Depression  Continue Zoloft.  Secondary hyperparathyroidism - Defer to nephrology  CAD (coronary artery disease)  -Patient is status post CABG. Troponins  negative. Continue aspirin and beta blocker  Disposition: Remain inpatient-suspect will need SNF on discharge  DVT Prophylaxis: Prophylactic Heparin  Code Status: Full code   Family Communication Son-Bradford over the phone 1/6  Procedures:  HD 1/5  CONSULTS:  nephrology   MEDICATIONS: Scheduled Meds: . amLODipine  5 mg Oral Daily  . antiseptic oral rinse  15 mL Mouth Rinse q12n4p  . aspirin EC  81 mg Oral Daily  . chlorhexidine  15 mL Mouth Rinse BID  . docusate sodium  100 mg Oral BID  . ferumoxytol  1,020 mg Intravenous Once  . heparin subcutaneous  5,000 Units Subcutaneous Q8H  . labetalol  200 mg Oral BID  . levothyroxine  112 mcg Oral QAC breakfast  . multivitamin  1 tablet Oral QHS  . sertraline  50 mg Oral Daily  . sodium chloride  3 mL Intravenous Q12H   Continuous Infusions:  PRN Meds:.acetaminophen, acetaminophen, hydrALAZINE, morphine injection, ondansetron (ZOFRAN) IV  Antibiotics: Anti-infectives   None       PHYSICAL EXAM: Vital signs in last 24 hours: Filed Vitals:   05/15/13 1711 05/15/13 2155 05/16/13 0427 05/16/13 1029  BP: 120/59 112/51 140/74 132/64  Pulse: 58 57 62 58  Temp: 97.4 F (36.3 C) 97.7 F (36.5 C) 98.4 F (36.9 C) 97.4 F (36.3 C)  TempSrc:  Oral Oral Oral  Resp: 16 16 16 16   Height:  5\' 9"  (1.753 m)    Weight:  64.275 kg (141 lb 11.2 oz)    SpO2: 95% 95% 95% 96%    Weight change: -0.125 kg (-4.4 oz) Filed Weights   05/14/13 0947 05/14/13 2028 05/15/13 2155  Weight: 64.4 kg (141 lb 15.6 oz) 64.864 kg (143 lb) 64.275 kg (141 lb 11.2 oz)   Body mass index is 20.92 kg/(m^2).   Gen Exam: Awake, and  mostly alert Neck: Supple, No JVD.   Chest: few bibasilar rales CVS: S1 S2 Regular, no murmurs.  Abdomen: soft, BS +, non tender, non distended.  Extremities: no edema, lower extremities warm to touch. Neurologic: Non Focal.   Skin: No Rash.   Wounds: N/A.    Intake/Output from previous day:  Intake/Output  Summary (Last 24 hours) at 05/16/13 1332 Last data filed at 05/16/13 0900  Gross per 24 hour  Intake     60 ml  Output      0 ml  Net     60 ml     LAB RESULTS: CBC  Recent Labs Lab 05/11/13 1950 05/12/13 0235 05/13/13 0150  WBC 9.5 8.9 12.2*  HGB 11.7* 10.8* 11.3*  HCT 34.5* 32.5* 33.2*  PLT 224 199 182  MCV 94.0 94.8 95.1  MCH 31.9 31.5 32.4  MCHC 33.9 33.2 34.0  RDW 14.2 14.2 14.3  LYMPHSABS 1.4  --   --   MONOABS 0.8  --   --   EOSABS 0.3  --   --   BASOSABS 0.0  --   --     Chemistries   Recent Labs Lab 05/12/13 0235 05/13/13 0150 05/13/13 0400 05/15/13 1341 05/16/13 0414  NA 139 139 141 140 140  K 4.5 4.8 4.8 4.2 4.3  CL 105 103 103 97 97  CO2 18* 18* 19 26 22   GLUCOSE 112* 186* 161* 100* 92  BUN 75* 76* 79* 77* 90*  CREATININE 3.49* 3.67* 3.77* 5.54* 6.36*  CALCIUM 8.7 8.7 8.7 8.1* 7.9*  MG 1.9  --   --   --   --     CBG: No results found for this basename: GLUCAP,  in the last 168 hours  GFR Estimated Creatinine Clearance: 7 ml/min (by C-G formula based on Cr of 6.36).  Coagulation profile No results found for this basename: INR, PROTIME,  in the last 168 hours  Cardiac Enzymes  Recent Labs Lab 05/12/13 0235 05/12/13 0800 05/12/13 1305  TROPONINI <0.30 <0.30 <0.30    No components found with this basename: POCBNP,  No results found for this basename: DDIMER,  in the last 72 hours No results found for this basename: HGBA1C,  in the last 72 hours No results found for this basename: CHOL, HDL, LDLCALC, TRIG, CHOLHDL, LDLDIRECT,  in the last 72 hours No results found for this basename: TSH, T4TOTAL, FREET3, T3FREE, THYROIDAB,  in the last 72 hours No results found for this basename: VITAMINB12, FOLATE, FERRITIN, TIBC, IRON, RETICCTPCT,  in the last 72 hours No results found for this basename: LIPASE, AMYLASE,  in the last 72 hours  Urine Studies No results found for this basename: UACOL, UAPR, USPG, UPH, UTP, UGL, UKET, UBIL, UHGB,  UNIT, UROB, ULEU, UEPI, UWBC, URBC, UBAC, CAST, CRYS, UCOM, BILUA,  in the last 72 hours  MICROBIOLOGY: Recent Results (from the past 240 hour(s))  URINE CULTURE     Status: None   Collection Time    05/12/13  2:34 AM      Result Value Range Status   Specimen Description URINE, RANDOM   Final   Special Requests A   Final   Culture  Setup Time     Final   Value: 05/12/2013 16:21     Performed at [facility]   Colony Count     Final   Value: NO GROWTH     Performed at [facility]   Culture  Final   Value: NO GROWTH     Performed at [facility]   Report Status 05/13/2013 FINAL   Final  MRSA PCR SCREENING     Status: None   Collection Time    05/12/13  4:59 PM      Result Value Range Status   MRSA by PCR NEGATIVE  NEGATIVE Final   Comment:            The GeneXpert MRSA Assay (FDA     approved for NASAL specimens     only), is one component of a     comprehensive MRSA colonization     surveillance program. It is not     intended to diagnose MRSA     infection nor to guide or     monitor treatment for     MRSA infections.    RADIOLOGY STUDIES/RESULTS: Dg Chest 2 View  05/11/2013   CLINICAL DATA:  Congestion  EXAM: CHEST  2 VIEW  COMPARISON:  03/21/2013  FINDINGS: There is bilateral interstitial thickening with patchy areas of alveolar airspace opacities. There are bilateral small pleural effusions. Stable cardiomediastinal silhouette. There is evidence of prior CABG. There is prominence of the central pulmonary vasculature.  The osseous structures are unremarkable.  IMPRESSION: Overall findings are most concerning for pulmonary edema.   Electronically Signed   By: Elige Ko   On: 05/11/2013 20:40   Ct Head Wo Contrast  05/13/2013   CLINICAL DATA:  Headache with nausea  EXAM: CT HEAD WITHOUT CONTRAST  TECHNIQUE: Contiguous axial images were obtained from the base of the skull through the vertex without intravenous contrast.  COMPARISON:  Prior CT from  05/11/2013  FINDINGS: Extensive age-related cerebral atrophy with chronic microvascular ischemic disease again seen common stable as compared to prior study. Encephalomalacia within the left parietal lobe again noted, consistent with remote infarct. No new large vessel territory infarct identified. There is no intracranial hemorrhage. No mass lesion or midline shift. No extra-axial fluid collection. Ventricles are normal in size without evidence of hydrocephalus.  Prominent vascular calcifications noted within the distal vertebral arteries as well as the carotid siphons.  Calvarium is intact. Orbits are normal. Few scattered opacities noted within the frontal sinuses bilaterally, unchanged. Paranasal sinuses are otherwise clear. No mastoid effusion.  IMPRESSION: 1. No acute intracranial process. 2. Stable atrophy with chronic microvascular ischemic disease with remote left parietal infarct.   Electronically Signed   By: Rise Mu M.D.   On: 05/13/2013 07:02   Ct Head Wo Contrast  05/11/2013   CLINICAL DATA:  Fatigue.  EXAM: CT HEAD WITHOUT CONTRAST  TECHNIQUE: Contiguous axial images were obtained from the base of the skull through the vertex without intravenous contrast.  COMPARISON:  None.  FINDINGS: Periventricular white matter and corona radiata hypodensities favor chronic ischemic microvascular white matter disease. Hypodensity in the left parietal lobe is somewhat confluent and favors a remote watershed infarct. I doubt that this is late subacute with regard to chronicity.  No intracranial hemorrhage or characteristic findings of acute infarct. No mass lesion observed. There is dense atherosclerotic calcification in the carotid siphons.  IMPRESSION: 1. Chronic microvascular disease, with suspected old left watershed infarct. No acute intracranial findings observed.   Electronically Signed   By: Herbie Baltimore M.D.   On: 05/11/2013 20:40   Dg Chest Port 1 View  05/13/2013   CLINICAL DATA:   Shortness of breath.  Follow-up edema.  EXAM: PORTABLE CHEST - 1 VIEW  COMPARISON:  05/11/2013 and 03/21/2013.  FINDINGS: 0733 hr. The heart size and mediastinal contours are stable status post CABG. There is increased diffuse interstitial prominence with patchy left greater than right basilar airspace opacities. In addition, there is peripheral density in the upper right hemithorax which is new from 2 months ago. This may reflect partially loculated pleural fluid. No pneumothorax is identified.  IMPRESSION: Increased interstitial and basilar pulmonary opacities suspicious for worsening edema. There is possible partially loculated pleural fluid on the right.   Electronically Signed   By: Roxy Horseman M.D.   On: 05/13/2013 10:28    Jeoffrey Massed, MD  [facility] Pager:[phone]  If 7PM-7AM, please contact night-coverage [email or web address] Password [email or web address] 05/16/2013, 1:32 PM   LOS: 5 days

## 2013-05-16 NOTE — Progress Notes (Signed)
S: No N/V  UO not being recorded O:BP 140/74  Pulse 62  Temp(Src) 98.4 F (36.9 C) (Oral)  Resp 16  Ht 5\' 9"  (1.753 m)  Wt 64.275 kg (141 lb 11.2 oz)  BMI 20.92 kg/m2  SpO2 95%  Intake/Output Summary (Last 24 hours) at 05/16/13 0657 Last data filed at 05/15/13 1851  Gross per 24 hour  Intake    240 ml  Output      0 ml  Net    240 ml   Weight change: -0.125 kg (-4.4 oz) ZOX:WRUEAGen:awake and alert CVS:RRR Resp:bil crackles Abd:+ BS NTND Ext: No edema   Rt UA AVF + bruit NEURO:CNI Ox3 no asterixis   . amLODipine  5 mg Oral Daily  . antiseptic oral rinse  15 mL Mouth Rinse q12n4p  . aspirin EC  81 mg Oral Daily  . chlorhexidine  15 mL Mouth Rinse BID  . docusate sodium  100 mg Oral BID  . furosemide  160 mg Oral BID  . heparin subcutaneous  5,000 Units Subcutaneous Q8H  . labetalol  200 mg Oral BID  . levothyroxine  112 mcg Oral QAC breakfast  . multivitamin  1 tablet Oral QHS  . sertraline  50 mg Oral Daily  . sodium chloride  3 mL Intravenous Q12H   No results found. BMET    Component Value Date/Time   NA 140 05/15/2013 1341   K 4.2 05/15/2013 1341   CL 97 05/15/2013 1341   CO2 26 05/15/2013 1341   GLUCOSE 100* 05/15/2013 1341   BUN 77* 05/15/2013 1341   CREATININE 5.54* 05/15/2013 1341   CALCIUM 8.1* 05/15/2013 1341   GFRNONAA 8* 05/15/2013 1341   GFRAA 9* 05/15/2013 1341   CBC    Component Value Date/Time   WBC 12.2* 05/13/2013 0150   RBC 3.49* 05/13/2013 0150   HGB 11.3* 05/13/2013 0150   HCT 33.2* 05/13/2013 0150   PLT 182 05/13/2013 0150   MCV 95.1 05/13/2013 0150   MCH 32.4 05/13/2013 0150   MCHC 34.0 05/13/2013 0150   RDW 14.3 05/13/2013 0150   LYMPHSABS 1.4 05/11/2013 1950   MONOABS 0.8 05/11/2013 1950   EOSABS 0.3 05/11/2013 1950   BASOSABS 0.0 05/11/2013 1950     Assessment: 1. Acute on CKD4, I suspect he is at ESRD.  Scr 5.54 yest after HD mon 2. Anemia and iron def 3. HTN 4. Pulm edema 5. Sec HPTH not yet requiring Vit D  Plan: 1. HD today 2 SW looking for SNF 3. IV iron 4.  DC Lasix 5. Spoke to ColemanJudy to start clip process   [place] Tomich T

## 2013-05-17 LAB — RENAL FUNCTION PANEL
Albumin: 3.4 g/dL — ABNORMAL LOW (ref 3.5–5.2)
BUN: 55 mg/dL — ABNORMAL HIGH (ref 6–23)
CO2: 23 meq/L (ref 19–32)
CREATININE: 4.83 mg/dL — AB (ref 0.50–1.35)
Calcium: 8.3 mg/dL — ABNORMAL LOW (ref 8.4–10.5)
Chloride: 97 mEq/L (ref 96–112)
GFR calc Af Amer: 11 mL/min — ABNORMAL LOW (ref >90)
GFR, EST NON AFRICAN AMERICAN: 10 mL/min — AB (ref >90)
Glucose, Bld: 122 mg/dL — ABNORMAL HIGH (ref 70–99)
PHOSPHORUS: 5.6 mg/dL — AB (ref 2.3–4.6)
Potassium: 3.8 mEq/L (ref 3.7–5.3)
Sodium: 140 mEq/L (ref 137–147)

## 2013-05-17 LAB — CBC
HEMATOCRIT: 34.5 % — AB (ref 39.0–52.0)
HEMOGLOBIN: 11.7 g/dL — AB (ref 13.0–17.0)
MCH: 32.2 pg (ref 26.0–34.0)
MCHC: 33.9 g/dL (ref 30.0–36.0)
MCV: 95 fL (ref 78.0–100.0)
Platelets: 141 10*3/uL — ABNORMAL LOW (ref 150–400)
RBC: 3.63 MIL/uL — ABNORMAL LOW (ref 4.22–5.81)
RDW: 14.2 % (ref 11.5–15.5)
WBC: 9.7 10*3/uL (ref 4.0–10.5)

## 2013-05-17 MED ORDER — RENA-VITE PO TABS: 1.0000 | ORAL_TABLET | Freq: Every day | ORAL | Status: DC

## 2013-05-17 MED ORDER — AMLODIPINE BESYLATE 5 MG PO TABS: 5.0000 mg | ORAL_TABLET | Freq: Every day | ORAL | Status: DC

## 2013-05-17 MED ORDER — LABETALOL HCL 200 MG PO TABS: 200.0000 mg | ORAL_TABLET | Freq: Two times a day (BID) | ORAL | Status: DC

## 2013-05-17 MED ORDER — DSS 100 MG PO CAPS: 100.0000 mg | ORAL_CAPSULE | Freq: Two times a day (BID) | ORAL | Status: AC

## 2013-05-17 NOTE — Progress Notes (Signed)
PATIENT DETAILS Name: [name] Age: 80 y.o. Sex: male Date of Birth: 29-Apr-1921 Admit Date: 05/11/2013 Admitting Physician Acey Lav, MD ZOX:[id number], Provider, MD  Brief summary [name] is an 80 y.o. male with a PMH of CAD status post CABG, stage IV chronic kidney disease status post-AV fistula who was admitted on 05/12/2012 with increasing confusion and fatigue. Upon initial evaluation in the ED, he was found to have a BUN of 77 and a creatinine of 3.63 (prior creatinine documented at 2.37 6/13). Patient was admitted, unfortunately hospital course has been prolonged and complicated by development of resumed metabolic encephalopathy from uremia, and also from acute respiratory failure from pulmonary edema in a setting of worsening renal function. Patient was initially admitted [facility], transferred to [facility] for dialysis. Unfortunately, at this time it is thought that the patient has progressed to end-stage renal disease. Currently patient is stable, awaiting skilled nursing facility placement.  Subjective:  Awake and alert this am. Ready for discharge upon receiving a bed with SNF.   Assessment/Plan:  Principal Problem:  Metabolic encephalopathy  - Suspected secondary to uremia, better post dialysis.Significantly improved and suspect back to baseline.  - CT of the head on 05/11/13 and 05/13/48 negative for acute abnormalities. MRI brain was ordered, however was not done because of patient's hemodynamic instability, do not think it is necessary at this point.    Acute respiratory failure- hypoxic  - Secondary to pulmonary edema in a setting of worsening kidney function. Initially required BiPAP, then nasal cannula, and now on room air. Was seen by nephrology, initially started on high-dose intravenous Lasix, minimal improvement with that, however with worsening encephalopathy, patient underwent dialysis with significant improvement. Currently using  oxygen by nasal cannula   Acute on chronic CHF  - Diuresis with hemodialysis.  - ACE inhibitor on hold-due to worsening renal function, if deemed ESRD this can be resumed.  - Echo on 05/13/13 showed EF around 35-40%   Acute on chronic kidney disease stage IV-now suspected ESRD  - Given acute pulmonary edema, metabolic encephalopathy from suspected uremia-patient underwent hemodialysis on 05/14/13.  - Since not much urine output, and creatinine still elevated-suspected ESRD.   Hypertension  - Controlled,continue with labetalol and amlodipine  Hypothyroidism  -Continue Synthroid.   Anemia of chronic disease  - Secondary to chronic kidney disease  -Hemoglobin stable with no indication for transfusion.   Depression  Continue Zoloft.   Secondary hyperparathyroidism  - Defer to nephrology   CAD (coronary artery disease)  -Patient is status post CABG. Troponins negative. Continue aspirin and beta blocker  Disposition: Will D/C upon bed opening with SNF.   DVT Prophylaxis: Prophylactic Heparin   Code Status: Full code   Family Communication SonMalvin Johns over the phone   Procedures:  HD 1/5  CONSULTS:  nephrology   MEDICATIONS: Scheduled Meds: . amLODipine  5 mg Oral Daily  . antiseptic oral rinse  15 mL Mouth Rinse q12n4p  . aspirin EC  81 mg Oral Daily  . chlorhexidine  15 mL Mouth Rinse BID  . docusate sodium  100 mg Oral BID  . heparin subcutaneous  5,000 Units Subcutaneous Q8H  . labetalol  200 mg Oral BID  . levothyroxine  112 mcg Oral QAC breakfast  . multivitamin  1 tablet Oral QHS  . sertraline  50 mg Oral Daily  . sodium chloride  3 mL Intravenous Q12H   Continuous Infusions:  PRN Meds:.acetaminophen, acetaminophen, hydrALAZINE, morphine  injection, ondansetron (ZOFRAN) IV  Antibiotics: Anti-infectives   None       PHYSICAL EXAM: Vital signs in last 24 hours: Filed Vitals:   05/16/13 2030 05/16/13 2050 05/16/13 2134 05/17/13 0452  BP: 126/70  108/61 160/66 142/64  Pulse: 52 53 56 62  Temp:  97.5 F (36.4 C) 97.5 F (36.4 C) 98.9 F (37.2 C)  TempSrc:  Axillary Axillary Oral  Resp:  23 22 20   Height:      Weight:  61.5 kg (135 lb 9.3 oz) 61 kg (134 lb 7.7 oz)   SpO2:  97% 99% 96%    Weight change: -0.375 kg (-13.2 oz) Filed Weights   05/16/13 1738 05/16/13 2050 05/16/13 2134  Weight: 63.9 kg (140 lb 14 oz) 61.5 kg (135 lb 9.3 oz) 61 kg (134 lb 7.7 oz)   Body mass index is 19.85 kg/(m^2).   Gen Exam: Awake and alert with clear speech.  Neck: Supple, No JVD.   Chest: B/L Clear.  CVS: S1 S2 Regular, no murmurs. Abdomen: soft, BS +, non tender, non distended.  Extremities: no edema, lower extremities warm to touch. Neurologic: Non Focal.   Skin: No Rash.   Wounds: N/A.    Intake/Output from previous day:  Intake/Output Summary (Last 24 hours) at 05/17/13 1012 Last data filed at 05/16/13 2050  Gross per 24 hour  Intake     60 ml  Output   2167 ml  Net  -2107 ml     LAB RESULTS: CBC  Recent Labs Lab 05/11/13 1950 05/12/13 0235 05/13/13 0150 05/17/13 0540  WBC 9.5 8.9 12.2* 9.7  HGB 11.7* 10.8* 11.3* 11.7*  HCT 34.5* 32.5* 33.2* 34.5*  PLT 224 199 182 141*  MCV 94.0 94.8 95.1 95.0  MCH 31.9 31.5 32.4 32.2  MCHC 33.9 33.2 34.0 33.9  RDW 14.2 14.2 14.3 14.2  LYMPHSABS 1.4  --   --   --   MONOABS 0.8  --   --   --   EOSABS 0.3  --   --   --   BASOSABS 0.0  --   --   --     Chemistries   Recent Labs Lab 05/12/13 0235 05/13/13 0150 05/13/13 0400 05/15/13 1341 05/16/13 0414 05/17/13 0540  NA 139 139 141 140 140 140  K 4.5 4.8 4.8 4.2 4.3 3.8  CL 105 103 103 97 97 97  CO2 18* 18* 19 26 22 23   GLUCOSE 112* 186* 161* 100* 92 122*  BUN 75* 76* 79* 77* 90* 55*  CREATININE 3.49* 3.67* 3.77* 5.54* 6.36* 4.83*  CALCIUM 8.7 8.7 8.7 8.1* 7.9* 8.3*  MG 1.9  --   --   --   --   --     CBG: No results found for this basename: GLUCAP,  in the last 168 hours  GFR Estimated Creatinine Clearance:  8.8 ml/min (by C-G formula based on Cr of 4.83).  Coagulation profile No results found for this basename: INR, PROTIME,  in the last 168 hours  Cardiac Enzymes  Recent Labs Lab 05/12/13 0235 05/12/13 0800 05/12/13 1305  TROPONINI <0.30 <0.30 <0.30    No components found with this basename: POCBNP,  No results found for this basename: DDIMER,  in the last 72 hours No results found for this basename: HGBA1C,  in the last 72 hours No results found for this basename: CHOL, HDL, LDLCALC, TRIG, CHOLHDL, LDLDIRECT,  in the last 72 hours No results found for this basename:  TSH, T4TOTAL, FREET3, T3FREE, THYROIDAB,  in the last 72 hours No results found for this basename: VITAMINB12, FOLATE, FERRITIN, TIBC, IRON, RETICCTPCT,  in the last 72 hours No results found for this basename: LIPASE, AMYLASE,  in the last 72 hours  Urine Studies No results found for this basename: UACOL, UAPR, USPG, UPH, UTP, UGL, UKET, UBIL, UHGB, UNIT, UROB, ULEU, UEPI, UWBC, URBC, UBAC, CAST, CRYS, UCOM, BILUA,  in the last 72 hours  MICROBIOLOGY: Recent Results (from the past 240 hour(s))  URINE CULTURE     Status: None   Collection Time    05/12/13  2:34 AM      Result Value Range Status   Specimen Description URINE, RANDOM   Final   Special Requests A   Final   Culture  Setup Time     Final   Value: 05/12/2013 16:21     Performed at [facility] Count     Final   Value: NO GROWTH     Performed at [facility]   Culture     Final   Value: NO GROWTH     Performed at [facility]   Report Status 05/13/2013 FINAL   Final  MRSA PCR SCREENING     Status: None   Collection Time    05/12/13  4:59 PM      Result Value Range Status   MRSA by PCR NEGATIVE  NEGATIVE Final   Comment:            The GeneXpert MRSA Assay (FDA     approved for NASAL specimens     only), is one component of a     comprehensive MRSA colonization     surveillance program. It is not     intended  to diagnose MRSA     infection nor to guide or     monitor treatment for     MRSA infections.    RADIOLOGY STUDIES/RESULTS: Dg Chest 2 View  05/11/2013   CLINICAL DATA:  Congestion  EXAM: CHEST  2 VIEW  COMPARISON:  03/21/2013  FINDINGS: There is bilateral interstitial thickening with patchy areas of alveolar airspace opacities. There are bilateral small pleural effusions. Stable cardiomediastinal silhouette. There is evidence of prior CABG. There is prominence of the central pulmonary vasculature.  The osseous structures are unremarkable.  IMPRESSION: Overall findings are most concerning for pulmonary edema.   Electronically Signed   By: Elige Ko   On: 05/11/2013 20:40   Ct Head Wo Contrast  05/13/2013   CLINICAL DATA:  Headache with nausea  EXAM: CT HEAD WITHOUT CONTRAST  TECHNIQUE: Contiguous axial images were obtained from the base of the skull through the vertex without intravenous contrast.  COMPARISON:  Prior CT from 05/11/2013  FINDINGS: Extensive age-related cerebral atrophy with chronic microvascular ischemic disease again seen common stable as compared to prior study. Encephalomalacia within the left parietal lobe again noted, consistent with remote infarct. No new large vessel territory infarct identified. There is no intracranial hemorrhage. No mass lesion or midline shift. No extra-axial fluid collection. Ventricles are normal in size without evidence of hydrocephalus.  Prominent vascular calcifications noted within the distal vertebral arteries as well as the carotid siphons.  Calvarium is intact. Orbits are normal. Few scattered opacities noted within the frontal sinuses bilaterally, unchanged. Paranasal sinuses are otherwise clear. No mastoid effusion.  IMPRESSION: 1. No acute intracranial process. 2. Stable atrophy with chronic microvascular ischemic disease with remote left  parietal infarct.   Electronically Signed   By: Rise Mu M.D.   On: 05/13/2013 07:02   Ct Head Wo  Contrast  05/11/2013   CLINICAL DATA:  Fatigue.  EXAM: CT HEAD WITHOUT CONTRAST  TECHNIQUE: Contiguous axial images were obtained from the base of the skull through the vertex without intravenous contrast.  COMPARISON:  None.  FINDINGS: Periventricular white matter and corona radiata hypodensities favor chronic ischemic microvascular white matter disease. Hypodensity in the left parietal lobe is somewhat confluent and favors a remote watershed infarct. I doubt that this is late subacute with regard to chronicity.  No intracranial hemorrhage or characteristic findings of acute infarct. No mass lesion observed. There is dense atherosclerotic calcification in the carotid siphons.  IMPRESSION: 1. Chronic microvascular disease, with suspected old left watershed infarct. No acute intracranial findings observed.   Electronically Signed   By: Herbie Baltimore M.D.   On: 05/11/2013 20:40   Dg Chest Port 1 View  05/13/2013   CLINICAL DATA:  Shortness of breath.  Follow-up edema.  EXAM: PORTABLE CHEST - 1 VIEW  COMPARISON:  05/11/2013 and 03/21/2013.  FINDINGS: 0733 hr. The heart size and mediastinal contours are stable status post CABG. There is increased diffuse interstitial prominence with patchy left greater than right basilar airspace opacities. In addition, there is peripheral density in the upper right hemithorax which is new from 2 months ago. This may reflect partially loculated pleural fluid. No pneumothorax is identified.  IMPRESSION: Increased interstitial and basilar pulmonary opacities suspicious for worsening edema. There is possible partially loculated pleural fluid on the right.   Electronically Signed   By: Roxy Horseman M.D.   On: 05/13/2013 10:28    Dorcas Carrow, MD  [facility] Pager:[phone]  If 7PM-7AM, please contact night-coverage [email or web address] Password [email or web address] 05/17/2013, 10:12 AM   LOS: 6 days   Attending Patient seen and examined at,agree with the above assessment and  plan. Continues to do well, now suspected to be esrdin the waiting for a skilled nursing facility bed.  Windell Norfolk MD

## 2013-05-17 NOTE — Discharge Summary (Signed)
PATIENT DETAILS Name: [name] Age: 80 y.o. Sex: male Date of Birth: 21-Jan-1921 MRN: [id number]. Admit Date: 05/11/2013 Admitting Physician: Wayne Lav, MD WGN:[id number], Provider, MD  Recommendations for Outpatient Follow-up:  1. Check CBC and BMET periodically  PRIMARY DISCHARGE DIAGNOSIS:  Principal Problem:   Metabolic encephalopathy Active Problems:   CAD (coronary artery disease)   Hypertensive heart disease   Chronic kidney disease (CKD), stage IV (severe) now ESRD   Hypothyroidism   Confusion   Abnormal chest x-ray   Metabolic acidosis   Acute pulmonary edema   Hypertensive crisis   Systolic and diastolic CHF, acute on chronic   Acute respiratory distress   Anemia of chronic disease   Secondary hyperparathyroidism   History of CVA (cerebrovascular accident)      PAST MEDICAL HISTORY: Past Medical History  Diagnosis Date  . Stroke   . CAD (coronary artery disease)     Prior CABG x 2 last in 2005    . Hypertensive heart disease without CHF   . Hyperlipidemia     not on any medications  . History of TIA (transient ischemic attack)   . Lumbar disc disease   . Osteoarthritis   . Hypertension     takes Amlodipine daily  . MI (myocardial infarction) 2005  . Shortness of breath     with exertion and sitting  . GERD (gastroesophageal reflux disease)     takes Omeprazole daily  . Chronic kidney disease (CKD), stage IV (severe)     ESRD  . History of blood transfusion     no abnormal reaction noted  . Hypothyroidism     takes Synthroid daily  . Hypothyroidism   . Depression     takes Zoloft daily    DISCHARGE MEDICATIONS:   Medication List    STOP taking these medications       benazepril 10 MG tablet  Commonly known as:  LOTENSIN      TAKE these medications       acetaminophen 500 MG tablet  Commonly known as:  TYLENOL  Take 500 mg by mouth daily as needed for pain.     amLODipine 5 MG tablet  Commonly known as:  NORVASC  Take 1  tablet (5 mg total) by mouth daily.     aspirin EC 81 MG tablet  Take 81 mg by mouth daily.     DSS 100 MG Caps  Take 100 mg by mouth 2 (two) times daily.     labetalol 200 MG tablet  Commonly known as:  NORMODYNE  Take 1 tablet (200 mg total) by mouth 2 (two) times daily.     levothyroxine 112 MCG tablet  Commonly known as:  SYNTHROID, LEVOTHROID  Take 112 mcg by mouth daily.     multivitamin Tabs tablet  Take 1 tablet by mouth at bedtime.     sertraline 100 MG tablet  Commonly known as:  ZOLOFT  Take 50 mg by mouth daily.        ALLERGIES:   Allergies  Allergen Reactions  . Codeine Other (See Comments)    Unknown reaction  . Morphine And Related     Delirium (per son, [name])  . Nitroglycerin     Delrium? & headache, (per son, [name])    BRIEF HPI:  See H&P, Labs, Consult and Test reports for all details in brief,[name] is an 80 y.o. male with a PMH of CAD status post CABG, stage IV chronic kidney disease  status post-AV fistula who was admitted on 05/12/2012 with increasing confusion and fatigue. Upon initial evaluation in the ED, he was found to have a BUN of 77 and a creatinine of 3.63 (prior creatinine documented at 2.37 6/13).   CONSULTATIONS:   nephrology  PERTINENT RADIOLOGIC STUDIES: Dg Chest 2 View  05/11/2013   CLINICAL DATA:  Congestion  EXAM: CHEST  2 VIEW  COMPARISON:  03/21/2013  FINDINGS: There is bilateral interstitial thickening with patchy areas of alveolar airspace opacities. There are bilateral small pleural effusions. Stable cardiomediastinal silhouette. There is evidence of prior CABG. There is prominence of the central pulmonary vasculature.  The osseous structures are unremarkable.  IMPRESSION: Overall findings are most concerning for pulmonary edema.   Electronically Signed   By: Elige Ko   On: 05/11/2013 20:40   Ct Head Wo Contrast  05/13/2013   CLINICAL DATA:  Headache with nausea  EXAM: CT HEAD WITHOUT CONTRAST  TECHNIQUE: Contiguous  axial images were obtained from the base of the skull through the vertex without intravenous contrast.  COMPARISON:  Prior CT from 05/11/2013  FINDINGS: Extensive age-related cerebral atrophy with chronic microvascular ischemic disease again seen common stable as compared to prior study. Encephalomalacia within the left parietal lobe again noted, consistent with remote infarct. No new large vessel territory infarct identified. There is no intracranial hemorrhage. No mass lesion or midline shift. No extra-axial fluid collection. Ventricles are normal in size without evidence of hydrocephalus.  Prominent vascular calcifications noted within the distal vertebral arteries as well as the carotid siphons.  Calvarium is intact. Orbits are normal. Few scattered opacities noted within the frontal sinuses bilaterally, unchanged. Paranasal sinuses are otherwise clear. No mastoid effusion.  IMPRESSION: 1. No acute intracranial process. 2. Stable atrophy with chronic microvascular ischemic disease with remote left parietal infarct.   Electronically Signed   By: Rise Mu M.D.   On: 05/13/2013 07:02   Ct Head Wo Contrast  05/11/2013   CLINICAL DATA:  Fatigue.  EXAM: CT HEAD WITHOUT CONTRAST  TECHNIQUE: Contiguous axial images were obtained from the base of the skull through the vertex without intravenous contrast.  COMPARISON:  None.  FINDINGS: Periventricular white matter and corona radiata hypodensities favor chronic ischemic microvascular white matter disease. Hypodensity in the left parietal lobe is somewhat confluent and favors a remote watershed infarct. I doubt that this is late subacute with regard to chronicity.  No intracranial hemorrhage or characteristic findings of acute infarct. No mass lesion observed. There is dense atherosclerotic calcification in the carotid siphons.  IMPRESSION: 1. Chronic microvascular disease, with suspected old left watershed infarct. No acute intracranial findings observed.    Electronically Signed   By: Herbie Baltimore M.D.   On: 05/11/2013 20:40   Dg Chest Port 1 View  05/13/2013   CLINICAL DATA:  Shortness of breath.  Follow-up edema.  EXAM: PORTABLE CHEST - 1 VIEW  COMPARISON:  05/11/2013 and 03/21/2013.  FINDINGS: 0733 hr. The heart size and mediastinal contours are stable status post CABG. There is increased diffuse interstitial prominence with patchy left greater than right basilar airspace opacities. In addition, there is peripheral density in the upper right hemithorax which is new from 2 months ago. This may reflect partially loculated pleural fluid. No pneumothorax is identified.  IMPRESSION: Increased interstitial and basilar pulmonary opacities suspicious for worsening edema. There is possible partially loculated pleural fluid on the right.   Electronically Signed   By: Roxy Horseman M.D.   On: 05/13/2013 10:28  PERTINENT LAB RESULTS: CBC:  Recent Labs  05/17/13 0540  WBC 9.7  HGB 11.7*  HCT 34.5*  PLT 141*   CMET CMP     Component Value Date/Time   NA 140 05/17/2013 0540   K 3.8 05/17/2013 0540   CL 97 05/17/2013 0540   CO2 23 05/17/2013 0540   GLUCOSE 122* 05/17/2013 0540   BUN 55* 05/17/2013 0540   CREATININE 4.83* 05/17/2013 0540   CALCIUM 8.3* 05/17/2013 0540   PROT 7.3 05/13/2013 0150   ALBUMIN 3.4* 05/17/2013 0540   AST 29 05/13/2013 0150   ALT 23 05/13/2013 0150   ALKPHOS 83 05/13/2013 0150   BILITOT 0.6 05/13/2013 0150   GFRNONAA 10* 05/17/2013 0540   GFRAA 11* 05/17/2013 0540    GFR Estimated Creatinine Clearance: 8.8 ml/min (by C-G formula based on Cr of 4.83). No results found for this basename: LIPASE, AMYLASE,  in the last 72 hours No results found for this basename: CKTOTAL, CKMB, CKMBINDEX, TROPONINI,  in the last 72 hours No components found with this basename: POCBNP,  No results found for this basename: DDIMER,  in the last 72 hours No results found for this basename: HGBA1C,  in the last 72 hours No results found for this basename: CHOL,  HDL, LDLCALC, TRIG, CHOLHDL, LDLDIRECT,  in the last 72 hours No results found for this basename: TSH, T4TOTAL, FREET3, T3FREE, THYROIDAB,  in the last 72 hours No results found for this basename: VITAMINB12, FOLATE, FERRITIN, TIBC, IRON, RETICCTPCT,  in the last 72 hours Coags: No results found for this basename: PT, INR,  in the last 72 hours Microbiology: Recent Results (from the past 240 hour(s))  URINE CULTURE     Status: None   Collection Time    05/12/13  2:34 AM      Result Value Range Status   Specimen Description URINE, RANDOM   Final   Special Requests A   Final   Culture  Setup Time     Final   Value: 05/12/2013 16:21     Performed at [facility]   Colony Count     Final   Value: NO GROWTH     Performed at [facility]   Culture     Final   Value: NO GROWTH     Performed at [facility]   Report Status 05/13/2013 FINAL   Final  MRSA PCR SCREENING     Status: None   Collection Time    05/12/13  4:59 PM      Result Value Range Status   MRSA by PCR NEGATIVE  NEGATIVE Final   Comment:            The GeneXpert MRSA Assay (FDA     approved for NASAL specimens     only), is one component of a     comprehensive MRSA colonization     surveillance program. It is not     intended to diagnose MRSA     infection nor to guide or     monitor treatment for     MRSA infections.     BRIEF HOSPITAL COURSE:  Brief summary  Dimple NanasSamuel W Uehara is an 80 y.o. male with a PMH of CAD status post CABG, stage IV chronic kidney disease status post-AV fistula who was admitted on 05/12/2012 with increasing confusion and fatigue. Upon initial evaluation in the ED, he was found to have a BUN of 77 and a creatinine of 3.63 (prior  creatinine documented at 2.37 6/13). Patient was admitted, unfortunately hospital course has been prolonged and complicated by development of resumed metabolic encephalopathy from uremia, and also from acute respiratory failure from pulmonary edema in  a setting of worsening renal function. Patient was initially admitted [facility], transferred to [facility] for dialysis. Unfortunately, at this time it is thought that the patient has progressed to end-stage renal disease. Currently patient is stable, awaiting skilled nursing facility placement.  See below for hospital course by problem list.  Metabolic encephalopathy  - Suspected secondary to uremia, better post dialysis.Significantly improved and suspect back to baseline.  - CT of the head on 05/11/13 and 05/13/48 negative for acute abnormalities. MRI brain was ordered, however was not done because of patient's hemodynamic instability, do not think it is necessary at this point.   Acute respiratory failure- hypoxic  - Secondary to pulmonary edema in a setting of worsening kidney function. Initially required BiPAP, then nasal cannula, and now on room air. Was seen by nephrology, initially started on high-dose intravenous Lasix, minimal improvement with that, however with worsening encephalopathy, patient underwent dialysis with significant improvement. Currently using oxygen by nasal cannula   Acute on chronic CHF  - Diuresis with hemodialysis.  - ACE inhibitor on hold-due to worsening renal function, if deemed ESRD this can be resumed.  - Echo on 05/13/13 showed EF around 35-40%   Acute on chronic kidney disease stage IV-now suspected ESRD  - Given acute pulmonary edema, metabolic encephalopathy from suspected uremia-patient underwent hemodialysis on 05/14/13.  - Since not much urine output, and creatinine still elevated-suspect patient has progressed to ESRD.   Hypertension  - Controlled,continue with labetalol and amlodipine   Hypothyroidism  -Continue Synthroid.   Anemia of chronic disease  - Secondary to chronic kidney disease  -Hemoglobin stable with no indication for transfusion.  -please monitor CBC periodically as outpatient  Depression  Continue Zoloft.    Secondary hyperparathyroidism  - Defer to nephrology   CAD (coronary artery disease)  -Patient is status post CABG. Troponins negative. Continue aspirin and beta blocker   TODAY-DAY OF DISCHARGE:  Subjective:   Malen Gauze today has no headache,no chest abdominal pain,no new weakness tingling or numbness, feels much better wants.  Objective:   Blood pressure 101/49, pulse 54, temperature 98.6 F (37 C), temperature source Oral, resp. rate 18, height 5\' 9"  (1.753 m), weight 61 kg (134 lb 7.7 oz), SpO2 96.00%.  Intake/Output Summary (Last 24 hours) at 05/17/13 1610 Last data filed at 05/17/13 1345  Gross per 24 hour  Intake    180 ml  Output   2167 ml  Net  -1987 ml   Filed Weights   05/16/13 1738 05/16/13 2050 05/16/13 2134  Weight: 63.9 kg (140 lb 14 oz) 61.5 kg (135 lb 9.3 oz) 61 kg (134 lb 7.7 oz)    Exam Awake Alert, Oriented *3-appears weak.No new F.N deficits, Normal affect [place].AT,PERRAL Supple Neck,No JVD, No cervical lymphadenopathy appriciated.  Symmetrical Chest wall movement, Good air movement bilaterally, CTAB RRR,No Gallops,Rubs or new Murmurs, No Parasternal Heave +ve B.Sounds, Abd Soft, Non tender, No organomegaly appriciated, No rebound -guarding or rigidity. No Cyanosis, Clubbing or edema, No new Rash or bruise  DISCHARGE CONDITION: Stable  DISPOSITION: SNF  DISCHARGE INSTRUCTIONS:    Activity:  As tolerated with Full fall precautions use walker/cane & assistance as needed  Diet recommendation: Renal Diet Heart Healthy diet       Discharge Orders  Future Orders Complete By Expires   Call MD for:  difficulty breathing, headache or visual disturbances  As directed    Diet - low sodium heart healthy  As directed    Scheduling Instructions:     Renal Diet   Increase activity slowly  As directed       Follow-up Information   Please follow up. (Tuesday, Thursday and Saturday)    Contact information:   [facility]     Total  Time spent on discharge equals 45 minutes.  SignedJeoffrey Massed 05/17/2013 4:10 PM

## 2013-05-17 NOTE — Progress Notes (Signed)
S: Feels well, eating breakfast O:BP 142/64  Pulse 62  Temp(Src) 98.9 F (37.2 C) (Oral)  Resp 20  Ht 5\' 9"  (1.753 m)  Wt 61 kg (134 lb 7.7 oz)  BMI 19.85 kg/m2  SpO2 96%  Intake/Output Summary (Last 24 hours) at 05/17/13 0850 Last data filed at 05/16/13 2050  Gross per 24 hour  Intake    120 ml  Output   2167 ml  Net  -2047 ml   Weight change: -0.375 kg (-13.2 oz) NWG:NFAOZGen:awake and alert CVS:RRR Resp: few faint crackles Abd:+ BS NTND Ext: No edema   Rt UA AVF + bruit NEURO:CNI Ox3 no asterixis   . amLODipine  5 mg Oral Daily  . antiseptic oral rinse  15 mL Mouth Rinse q12n4p  . aspirin EC  81 mg Oral Daily  . chlorhexidine  15 mL Mouth Rinse BID  . docusate sodium  100 mg Oral BID  . heparin subcutaneous  5,000 Units Subcutaneous Q8H  . labetalol  200 mg Oral BID  . levothyroxine  112 mcg Oral QAC breakfast  . multivitamin  1 tablet Oral QHS  . sertraline  50 mg Oral Daily  . sodium chloride  3 mL Intravenous Q12H   No results found. BMET    Component Value Date/Time   NA 140 05/17/2013 0540   K 3.8 05/17/2013 0540   CL 97 05/17/2013 0540   CO2 23 05/17/2013 0540   GLUCOSE 122* 05/17/2013 0540   BUN 55* 05/17/2013 0540   CREATININE 4.83* 05/17/2013 0540   CALCIUM 8.3* 05/17/2013 0540   GFRNONAA 10* 05/17/2013 0540   GFRAA 11* 05/17/2013 0540   CBC    Component Value Date/Time   WBC 9.7 05/17/2013 0540   RBC 3.63* 05/17/2013 0540   HGB 11.7* 05/17/2013 0540   HCT 34.5* 05/17/2013 0540   PLT 141* 05/17/2013 0540   MCV 95.0 05/17/2013 0540   MCH 32.2 05/17/2013 0540   MCHC 33.9 05/17/2013 0540   RDW 14.2 05/17/2013 0540   LYMPHSABS 1.4 05/11/2013 1950   MONOABS 0.8 05/11/2013 1950   EOSABS 0.3 05/11/2013 1950   BASOSABS 0.0 05/11/2013 1950     Assessment: 1. Acute on CKD4, I suspect he is at ESRD.  Scr 5.54 yest after HD mon 2. Anemia and iron def 3. HTN 4. Pulm edema 5. Sec HPTH not yet requiring Vit D  Plan: 1. May wait til Sat for next HD to get on TTS schedule [facility] 2. Awaiting NH placement   [name] T

## 2013-05-18 NOTE — Progress Notes (Signed)
S: no new CO  Not eating much O:BP 128/71  Pulse 57  Temp(Src) 97.8 F (36.6 C) (Oral)  Resp 18  Ht 5\' 9"  (1.753 m)  Wt 61.4 kg (135 lb 5.8 oz)  BMI 19.98 kg/m2  SpO2 96%  Intake/Output Summary (Last 24 hours) at 05/18/13 0845 Last data filed at 05/17/13 1345  Gross per 24 hour  Intake    180 ml  Output      0 ml  Net    180 ml   Weight change: -2.5 kg (-5 lb 8.2 oz) WGN:FAOZHGen:awake and alert CVS:RRR Resp: few faint crackles Abd:+ BS NTND Ext: No edema   Rt UA AVF + bruit NEURO:CNI Ox3 no asterixis He is oriented only to person   . amLODipine  5 mg Oral Daily  . antiseptic oral rinse  15 mL Mouth Rinse q12n4p  . aspirin EC  81 mg Oral Daily  . chlorhexidine  15 mL Mouth Rinse BID  . docusate sodium  100 mg Oral BID  . heparin subcutaneous  5,000 Units Subcutaneous Q8H  . labetalol  200 mg Oral BID  . levothyroxine  112 mcg Oral QAC breakfast  . multivitamin  1 tablet Oral QHS  . sertraline  50 mg Oral Daily  . sodium chloride  3 mL Intravenous Q12H   No results found. BMET    Component Value Date/Time   NA 140 05/17/2013 0540   K 3.8 05/17/2013 0540   CL 97 05/17/2013 0540   CO2 23 05/17/2013 0540   GLUCOSE 122* 05/17/2013 0540   BUN 55* 05/17/2013 0540   CREATININE 4.83* 05/17/2013 0540   CALCIUM 8.3* 05/17/2013 0540   GFRNONAA 10* 05/17/2013 0540   GFRAA 11* 05/17/2013 0540   CBC    Component Value Date/Time   WBC 9.7 05/17/2013 0540   RBC 3.63* 05/17/2013 0540   HGB 11.7* 05/17/2013 0540   HCT 34.5* 05/17/2013 0540   PLT 141* 05/17/2013 0540   MCV 95.0 05/17/2013 0540   MCH 32.2 05/17/2013 0540   MCHC 33.9 05/17/2013 0540   RDW 14.2 05/17/2013 0540   LYMPHSABS 1.4 05/11/2013 1950   MONOABS 0.8 05/11/2013 1950   EOSABS 0.3 05/11/2013 1950   BASOSABS 0.0 05/11/2013 1950     Assessment: 1. Acute on CKD4, I suspect he is at ESRD.  Scr 5.54 yest after HD mon 2. Anemia and iron def 3. HTN 4. Pulm edema 5. Sec HPTH not yet requiring Vit D  Plan: 1. Plan HD tomorrow 2.  If he can transfer at  least to a wheelchair so he can get to oupt HD then he could go after HD tomorrow  Kaislyn Gulas T

## 2013-05-18 NOTE — Progress Notes (Signed)
PATIENT DETAILS Name: [name] Age: 80 y.o. Sex: male Date of Birth: 1921/01/21 Admit Date: 05/11/2013 Admitting Physician Acey Lav, MD [id number], Provider, MD  Brief summary [name] is an 80 y.o. male with a PMH of CAD status post CABG, stage IV chronic kidney disease status post-AV fistula who was admitted on 05/12/2012 with increasing confusion and fatigue. Upon initial evaluation in the ED, he was found to have a BUN of 77 and a creatinine of 3.63 (prior creatinine documented at 2.37 6/13). Patient was admitted, unfortunately hospital course has been prolonged and complicated by development of resumed metabolic encephalopathy from uremia, and also from acute respiratory failure from pulmonary edema in a setting of worsening renal function. Patient was initially admitted [facility], transferred to [facility] for dialysis. Unfortunately, at this time it is thought that the patient has progressed to end-stage renal disease. Currently patient is stable, awaiting skilled nursing facility placement.  Subjective:  Awake and alert this am. Denies any complaints. Chart reviewed.   Assessment/Plan:  Principal Problem:  Metabolic encephalopathy  - Suspected secondary to uremia, better post dialysis.Significantly improved and suspect back to baseline.  - CT of the head on 05/11/13 and 05/13/48 negative for acute abnormalities. MRI brain was ordered, however was not done because of patient's hemodynamic instability, do not think it is necessary at this point.    Acute respiratory failure- hypoxic  - Secondary to pulmonary edema in a setting of worsening kidney function. Initially required BiPAP, then nasal cannula, and now on room air. Was seen by nephrology, initially started on high-dose intravenous Lasix, minimal improvement with that, however with worsening encephalopathy, patient underwent dialysis with significant improvement. Currently using oxygen by nasal  cannula   Acute on chronic CHF  - Diuresis with hemodialysis.  - ACE inhibitor o remains on hold-renal to follow and resume when considered appropriate - Echo on 05/13/13 showed EF around 35-40%   Acute on chronic kidney disease stage IV-now suspected ESRD  - Given acute pulmonary edema, metabolic encephalopathy from suspected uremia-patient underwent hemodialysis on 05/14/13.  - Since not much urine output, and creatinine still elevated-suspected ESRD.   Hypertension  - Controlled,continue with labetalol and amlodipine  Hypothyroidism  -Continue Synthroid.   Anemia of chronic disease  - Secondary to chronic kidney disease  -Hemoglobin stable with no indication for transfusion.   Depression  Continue Zoloft.   Secondary hyperparathyroidism  - Defer to nephrology   CAD (coronary artery disease)  -Patient is status post CABG. Troponins negative. Continue aspirin and beta blocker  Disposition: Will D/C upon bed opening with SNF.   DVT Prophylaxis: Prophylactic Heparin   Code Status: Full code   Family Communication SonMalvin Johns over the phone   Procedures:  HD 1/5  CONSULTS:  nephrology   MEDICATIONS: Scheduled Meds: . amLODipine  5 mg Oral Daily  . antiseptic oral rinse  15 mL Mouth Rinse q12n4p  . aspirin EC  81 mg Oral Daily  . chlorhexidine  15 mL Mouth Rinse BID  . docusate sodium  100 mg Oral BID  . heparin subcutaneous  5,000 Units Subcutaneous Q8H  . labetalol  200 mg Oral BID  . levothyroxine  112 mcg Oral QAC breakfast  . multivitamin  1 tablet Oral QHS  . sertraline  50 mg Oral Daily  . sodium chloride  3 mL Intravenous Q12H   Continuous Infusions:  PRN Meds:.acetaminophen, acetaminophen, hydrALAZINE, morphine injection, ondansetron (ZOFRAN) IV  Antibiotics: Anti-infectives  None       PHYSICAL EXAM: Vital signs in last 24 hours: Filed Vitals:   05/17/13 2030 05/18/13 0436 05/18/13 1215 05/18/13 1802  BP: 120/54 128/71 126/52 154/51   Pulse: 52 57 62 55  Temp: 97.5 F (36.4 C) 97.8 F (36.6 C) 97.8 F (36.6 C) 97.6 F (36.4 C)  TempSrc: Oral Oral Oral Oral  Resp: 18 18 18 18   Height: 5\' 9"  (1.753 m)     Weight: 61.4 kg (135 lb 5.8 oz)     SpO2: 96% 96% 96% 97%    Weight change: -2.5 kg (-5 lb 8.2 oz) Filed Weights   05/16/13 2050 05/16/13 2134 05/17/13 2030  Weight: 61.5 kg (135 lb 9.3 oz) 61 kg (134 lb 7.7 oz) 61.4 kg (135 lb 5.8 oz)   Body mass index is 19.98 kg/(m^2).   Gen Exam: Awake and alert with clear speech.  Neck: Supple, No JVD.   Chest: B/L Clear.  CVS: S1 S2 Regular, no murmurs. Abdomen: soft, BS +, non tender, non distended.  Extremities: no edema, lower extremities warm to touch. Neurologic: Non Focal.   Skin: No Rash.   Wounds: N/A.    Intake/Output from previous day:  Intake/Output Summary (Last 24 hours) at 05/18/13 1854 Last data filed at 05/18/13 1300  Gross per 24 hour  Intake    240 ml  Output      0 ml  Net    240 ml     LAB RESULTS: CBC  Recent Labs Lab 05/11/13 1950 05/12/13 0235 05/13/13 0150 05/17/13 0540  WBC 9.5 8.9 12.2* 9.7  HGB 11.7* 10.8* 11.3* 11.7*  HCT 34.5* 32.5* 33.2* 34.5*  PLT 224 199 182 141*  MCV 94.0 94.8 95.1 95.0  MCH 31.9 31.5 32.4 32.2  MCHC 33.9 33.2 34.0 33.9  RDW 14.2 14.2 14.3 14.2  LYMPHSABS 1.4  --   --   --   MONOABS 0.8  --   --   --   EOSABS 0.3  --   --   --   BASOSABS 0.0  --   --   --     Chemistries   Recent Labs Lab 05/12/13 0235 05/13/13 0150 05/13/13 0400 05/15/13 1341 05/16/13 0414 05/17/13 0540  NA 139 139 141 140 140 140  K 4.5 4.8 4.8 4.2 4.3 3.8  CL 105 103 103 97 97 97  CO2 18* 18* 19 26 22 23   GLUCOSE 112* 186* 161* 100* 92 122*  BUN 75* 76* 79* 77* 90* 55*  CREATININE 3.49* 3.67* 3.77* 5.54* 6.36* 4.83*  CALCIUM 8.7 8.7 8.7 8.1* 7.9* 8.3*  MG 1.9  --   --   --   --   --     CBG: No results found for this basename: GLUCAP,  in the last 168 hours  GFR Estimated Creatinine Clearance: 8.8  ml/min (by C-G formula based on Cr of 4.83).  Coagulation profile No results found for this basename: INR, PROTIME,  in the last 168 hours  Cardiac Enzymes  Recent Labs Lab 05/12/13 0235 05/12/13 0800 05/12/13 1305  TROPONINI <0.30 <0.30 <0.30    No components found with this basename: POCBNP,  No results found for this basename: DDIMER,  in the last 72 hours No results found for this basename: HGBA1C,  in the last 72 hours No results found for this basename: CHOL, HDL, LDLCALC, TRIG, CHOLHDL, LDLDIRECT,  in the last 72 hours No results found for this basename: TSH, T4TOTAL,  FREET3, T3FREE, THYROIDAB,  in the last 72 hours No results found for this basename: VITAMINB12, FOLATE, FERRITIN, TIBC, IRON, RETICCTPCT,  in the last 72 hours No results found for this basename: LIPASE, AMYLASE,  in the last 72 hours  Urine Studies No results found for this basename: UACOL, UAPR, USPG, UPH, UTP, UGL, UKET, UBIL, UHGB, UNIT, UROB, ULEU, UEPI, UWBC, URBC, UBAC, CAST, CRYS, UCOM, BILUA,  in the last 72 hours  MICROBIOLOGY: Recent Results (from the past 240 hour(s))  URINE CULTURE     Status: None   Collection Time    05/12/13  2:34 AM      Result Value Range Status   Specimen Description URINE, RANDOM   Final   Special Requests A   Final   Culture  Setup Time     Final   Value: 05/12/2013 16:21     Performed at [facility] Count     Final   Value: NO GROWTH     Performed at [facility]   Culture     Final   Value: NO GROWTH     Performed at [facility]   Report Status 05/13/2013 FINAL   Final  MRSA PCR SCREENING     Status: None   Collection Time    05/12/13  4:59 PM      Result Value Range Status   MRSA by PCR NEGATIVE  NEGATIVE Final   Comment:            The GeneXpert MRSA Assay (FDA     approved for NASAL specimens     only), is one component of a     comprehensive MRSA colonization     surveillance program. It is not     intended to  diagnose MRSA     infection nor to guide or     monitor treatment for     MRSA infections.    RADIOLOGY STUDIES/RESULTS: Dg Chest 2 View  05/11/2013   CLINICAL DATA:  Congestion  EXAM: CHEST  2 VIEW  COMPARISON:  03/21/2013  FINDINGS: There is bilateral interstitial thickening with patchy areas of alveolar airspace opacities. There are bilateral small pleural effusions. Stable cardiomediastinal silhouette. There is evidence of prior CABG. There is prominence of the central pulmonary vasculature.  The osseous structures are unremarkable.  IMPRESSION: Overall findings are most concerning for pulmonary edema.   Electronically Signed   By: Elige Ko   On: 05/11/2013 20:40   Ct Head Wo Contrast  05/13/2013   CLINICAL DATA:  Headache with nausea  EXAM: CT HEAD WITHOUT CONTRAST  TECHNIQUE: Contiguous axial images were obtained from the base of the skull through the vertex without intravenous contrast.  COMPARISON:  Prior CT from 05/11/2013  FINDINGS: Extensive age-related cerebral atrophy with chronic microvascular ischemic disease again seen common stable as compared to prior study. Encephalomalacia within the left parietal lobe again noted, consistent with remote infarct. No new large vessel territory infarct identified. There is no intracranial hemorrhage. No mass lesion or midline shift. No extra-axial fluid collection. Ventricles are normal in size without evidence of hydrocephalus.  Prominent vascular calcifications noted within the distal vertebral arteries as well as the carotid siphons.  Calvarium is intact. Orbits are normal. Few scattered opacities noted within the frontal sinuses bilaterally, unchanged. Paranasal sinuses are otherwise clear. No mastoid effusion.  IMPRESSION: 1. No acute intracranial process. 2. Stable atrophy with chronic microvascular ischemic disease with remote left parietal infarct.  Electronically Signed   By: Rise MuBenjamin  McClintock M.D.   On: 05/13/2013 07:02   Ct Head Wo  Contrast  05/11/2013   CLINICAL DATA:  Fatigue.  EXAM: CT HEAD WITHOUT CONTRAST  TECHNIQUE: Contiguous axial images were obtained from the base of the skull through the vertex without intravenous contrast.  COMPARISON:  None.  FINDINGS: Periventricular white matter and corona radiata hypodensities favor chronic ischemic microvascular white matter disease. Hypodensity in the left parietal lobe is somewhat confluent and favors a remote watershed infarct. I doubt that this is late subacute with regard to chronicity.  No intracranial hemorrhage or characteristic findings of acute infarct. No mass lesion observed. There is dense atherosclerotic calcification in the carotid siphons.  IMPRESSION: 1. Chronic microvascular disease, with suspected old left watershed infarct. No acute intracranial findings observed.   Electronically Signed   By: Herbie BaltimoreWalt  Liebkemann M.D.   On: 05/11/2013 20:40   Dg Chest Port 1 View  05/13/2013   CLINICAL DATA:  Shortness of breath.  Follow-up edema.  EXAM: PORTABLE CHEST - 1 VIEW  COMPARISON:  05/11/2013 and 03/21/2013.  FINDINGS: 0733 hr. The heart size and mediastinal contours are stable status post CABG. There is increased diffuse interstitial prominence with patchy left greater than right basilar airspace opacities. In addition, there is peripheral density in the upper right hemithorax which is new from 2 months ago. This may reflect partially loculated pleural fluid. No pneumothorax is identified.  IMPRESSION: Increased interstitial and basilar pulmonary opacities suspicious for worsening edema. There is possible partially loculated pleural fluid on the right.   Electronically Signed   By: Roxy HorsemanBill  Veazey M.D.   On: 05/13/2013 10:28    Kela MillinVIYUOH,Mckaylie Vasey C, MD  [facility] Pager:[phone]  If 7PM-7AM, please contact night-coverage [email or web address] Password [email or web address] 05/18/2013, 6:54 PM   LOS: 7 days   Attending Patient seen and examined at,agree with the above assessment and plan.  Continues to do well, now suspected to be esrdin the waiting for a skilled nursing facility bed.  Windell NorfolkS Ghimire MD

## 2013-05-19 DIAGNOSIS — N2581 Secondary hyperparathyroidism of renal origin: Secondary | ICD-10-CM

## 2013-05-19 LAB — RENAL FUNCTION PANEL
Albumin: 3.2 g/dL — ABNORMAL LOW (ref 3.5–5.2)
BUN: 88 mg/dL — ABNORMAL HIGH (ref 6–23)
CO2: 21 mEq/L (ref 19–32)
Calcium: 8.1 mg/dL — ABNORMAL LOW (ref 8.4–10.5)
Chloride: 97 mEq/L (ref 96–112)
Creatinine, Ser: 6.74 mg/dL — ABNORMAL HIGH (ref 0.50–1.35)
GFR calc Af Amer: 7 mL/min — ABNORMAL LOW (ref >90)
GFR calc non Af Amer: 6 mL/min — ABNORMAL LOW (ref >90)
GLUCOSE: 106 mg/dL — AB (ref 70–99)
POTASSIUM: 4.3 meq/L (ref 3.7–5.3)
Phosphorus: 8.7 mg/dL — ABNORMAL HIGH (ref 2.3–4.6)
Sodium: 141 mEq/L (ref 137–147)

## 2013-05-19 MED ORDER — ONDANSETRON HCL 4 MG/2ML IJ SOLN
INTRAMUSCULAR | Status: AC
  Filled 2013-05-19: qty 2

## 2013-05-19 MED ORDER — CALCIUM ACETATE 667 MG PO CAPS: 667.0000 mg | ORAL_CAPSULE | Freq: Three times a day (TID) | ORAL | Status: DC

## 2013-05-19 MED ORDER — CALCIUM ACETATE 667 MG PO CAPS
667.0000 mg | ORAL_CAPSULE | Freq: Three times a day (TID) | ORAL | Status: DC
  Filled 2013-05-19 (×3): qty 1

## 2013-05-19 NOTE — Procedures (Signed)
Pt seen on HD.  Ap 140 Vp 80.  AP high so unable to get BFR 300.  He is a little withdrawn this AM.  With his underlying dementia, he will have his good days and bad days. Will start PO4 binder.  ? To NH today.

## 2013-05-19 NOTE — Progress Notes (Signed)
[facility].  Pt's family aware of discharge and completed paperwork at SNF yesterday.    [phone] (weekend CSW)

## 2013-05-19 NOTE — Progress Notes (Signed)
PATIENT DETAILS Name: EHREN BERISHA Age: 80 y.o. Sex: male Date of Birth: [birth year] Admit Date: 05/11/2013 Admitting Physician Acey Lav, MD [id number], Provider, MD  Brief summary SAMRAT HAYWARD is an 80 y.o. male with a PMH of CAD status post CABG, stage IV chronic kidney disease status post-AV fistula who was admitted on 05/12/2012 with increasing confusion and fatigue. Upon initial evaluation in the ED, he was found to have a BUN of 77 and a creatinine of 3.63 (prior creatinine documented at 2.37 6/13). Patient was admitted, unfortunately hospital course has been prolonged and complicated by development of resumed metabolic encephalopathy from uremia, and also from acute respiratory failure from pulmonary edema in a setting of worsening renal function. Patient was initially admitted [facility], transferred to [facility] for dialysis. Unfortunately, at this time it is thought that the patient has progressed to end-stage renal disease. Currently patient is stable, awaiting skilled nursing facility placement.  Subjective:  S/p dialysis this am. Alert and appropriate, sitting up to eat lunch. Denies any complaints.   Assessment/Plan:  Principal Problem:  Metabolic encephalopathy  - Suspected secondary to uremia, better post dialysis.Significantly improved and suspect back to baseline.  - CT of the head on 05/11/13 and 05/13/48 negative for acute abnormalities. MRI brain was ordered, however was not done because of patient's hemodynamic instability, and it was deemed unnecessary at this point.    Acute respiratory failure- hypoxic  - Secondary to pulmonary edema in a setting of worsening kidney function. Initially required BiPAP, then nasal cannula, and now on room air. Was seen by nephrology, initially started on high-dose intravenous Lasix, minimal improvement with that, however with worsening encephalopathy, patient underwent dialysis with significant improvement.  Currently using oxygen by nasal cannula   Acute on chronic CHF  - Diuresis with hemodialysis.  - ACE inhibitor o remains on hold-renal to follow and resume when considered appropriate - Echo on 05/13/13 showed EF around 35-40%   Acute on chronic kidney disease stage IV-now suspected ESRD  - Given acute pulmonary edema, metabolic encephalopathy from suspected uremia-patient underwent hemodialysis on 05/14/13.  - Since not much urine output, and creatinine still elevated>>ESRD.  -He is medically ready for discharge today -to continue dialysis on TTS schedule per renal. Please see the full discharge summary dictated on 1/8, unchanged  Hypertension  - Controlled,continue with labetalol and amlodipine  Hypothyroidism  -Continue Synthroid.   Anemia of chronic disease  - Secondary to chronic kidney disease  -Hemoglobin stable with no indication for transfusion.   Depression  Continue Zoloft.   Secondary hyperparathyroidism  - Defer to nephrology   CAD (coronary artery disease)  -Patient is status post CABG. Troponins negative. Continue aspirin and beta blocker  Disposition: D/C to SNF today.   DVT Prophylaxis: Prophylactic Heparin   Code Status: Full code   Family Communication SonMalvin Johns over the phone   Procedures:  HD 1/5  CONSULTS:  nephrology   MEDICATIONS: Scheduled Meds: . amLODipine  5 mg Oral Daily  . antiseptic oral rinse  15 mL Mouth Rinse q12n4p  . aspirin EC  81 mg Oral Daily  . calcium acetate  667 mg Oral TID WC  . chlorhexidine  15 mL Mouth Rinse BID  . docusate sodium  100 mg Oral BID  . heparin subcutaneous  5,000 Units Subcutaneous Q8H  . labetalol  200 mg Oral BID  . levothyroxine  112 mcg Oral QAC breakfast  . multivitamin  1 tablet Oral  QHS  . ondansetron      . sertraline  50 mg Oral Daily  . sodium chloride  3 mL Intravenous Q12H   Continuous Infusions:  PRN Meds:.acetaminophen, acetaminophen, hydrALAZINE, morphine injection,  ondansetron (ZOFRAN) IV  Antibiotics: Anti-infectives   None       PHYSICAL EXAM: Vital signs in last 24 hours: Filed Vitals:   05/19/13 0830 05/19/13 0900 05/19/13 0930 05/19/13 0948  BP: 115/87 131/47 129/51 125/53  Pulse: 93 67 70 69  Temp:    97.5 F (36.4 C)  TempSrc:    Oral  Resp:    22  Height:      Weight:    59.8 kg (131 lb 13.4 oz)  SpO2:    95%    Weight change: -5.1 kg (-11 lb 3.9 oz) Filed Weights   05/18/13 2043 05/19/13 0710 05/19/13 0948  Weight: 56.3 kg (124 lb 1.9 oz) 60.4 kg (133 lb 2.5 oz) 59.8 kg (131 lb 13.4 oz)   Body mass index is 19.46 kg/(m^2).   Gen Exam: Awake and alert with clear speech.  Neck: Supple, No JVD.   Chest: B/L Clear.  CVS: S1 S2 Regular, no murmurs. Abdomen: soft, BS +, non tender, non distended.  Extremities: no edema, lower extremities warm to touch. Neurologic: Non Focal.   Skin: No Rash.   Wounds: N/A.    Intake/Output from previous day:  Intake/Output Summary (Last 24 hours) at 05/19/13 1314 Last data filed at 05/19/13 0948  Gross per 24 hour  Intake    490 ml  Output    537 ml  Net    -47 ml     LAB RESULTS: CBC  Recent Labs Lab 05/13/13 0150 05/17/13 0540  WBC 12.2* 9.7  HGB 11.3* 11.7*  HCT 33.2* 34.5*  PLT 182 141*  MCV 95.1 95.0  MCH 32.4 32.2  MCHC 34.0 33.9  RDW 14.3 14.2    Chemistries   Recent Labs Lab 05/13/13 0400 05/15/13 1341 05/16/13 0414 05/17/13 0540 05/19/13 0720  NA 141 140 140 140 141  K 4.8 4.2 4.3 3.8 4.3  CL 103 97 97 97 97  CO2 19 26 22 23 21   GLUCOSE 161* 100* 92 122* 106*  BUN 79* 77* 90* 55* 88*  CREATININE 3.77* 5.54* 6.36* 4.83* 6.74*  CALCIUM 8.7 8.1* 7.9* 8.3* 8.1*    CBG: No results found for this basename: GLUCAP,  in the last 168 hours  GFR Estimated Creatinine Clearance: 6.2 ml/min (by C-G formula based on Cr of 6.74).  Coagulation profile No results found for this basename: INR, PROTIME,  in the last 168 hours  Cardiac Enzymes No results  found for this basename: CK, CKMB, TROPONINI, MYOGLOBIN,  in the last 168 hours  No components found with this basename: POCBNP,  No results found for this basename: DDIMER,  in the last 72 hours No results found for this basename: HGBA1C,  in the last 72 hours No results found for this basename: CHOL, HDL, LDLCALC, TRIG, CHOLHDL, LDLDIRECT,  in the last 72 hours No results found for this basename: TSH, T4TOTAL, FREET3, T3FREE, THYROIDAB,  in the last 72 hours No results found for this basename: VITAMINB12, FOLATE, FERRITIN, TIBC, IRON, RETICCTPCT,  in the last 72 hours No results found for this basename: LIPASE, AMYLASE,  in the last 72 hours  Urine Studies No results found for this basename: UACOL, UAPR, USPG, UPH, UTP, UGL, UKET, UBIL, UHGB, UNIT, UROB, ULEU, UEPI, UWBC, URBC, UBAC, CAST,  CRYS, UCOM, BILUA,  in the last 72 hours  MICROBIOLOGY: Recent Results (from the past 240 hour(s))  URINE CULTURE     Status: None   Collection Time    05/12/13  2:34 AM      Result Value Range Status   Specimen Description URINE, RANDOM   Final   Special Requests A   Final   Culture  Setup Time     Final   Value: 05/12/2013 16:21     Performed at [facility] Count     Final   Value: NO GROWTH     Performed at [facility]   Culture     Final   Value: NO GROWTH     Performed at [facility]   Report Status 05/13/2013 FINAL   Final  MRSA PCR SCREENING     Status: None   Collection Time    05/12/13  4:59 PM      Result Value Range Status   MRSA by PCR NEGATIVE  NEGATIVE Final   Comment:            The GeneXpert MRSA Assay (FDA     approved for NASAL specimens     only), is one component of a     comprehensive MRSA colonization     surveillance program. It is not     intended to diagnose MRSA     infection nor to guide or     monitor treatment for     MRSA infections.    RADIOLOGY STUDIES/RESULTS: Dg Chest 2 View  05/11/2013   CLINICAL DATA:   Congestion  EXAM: CHEST  2 VIEW  COMPARISON:  03/21/2013  FINDINGS: There is bilateral interstitial thickening with patchy areas of alveolar airspace opacities. There are bilateral small pleural effusions. Stable cardiomediastinal silhouette. There is evidence of prior CABG. There is prominence of the central pulmonary vasculature.  The osseous structures are unremarkable.  IMPRESSION: Overall findings are most concerning for pulmonary edema.   Electronically Signed   By: Elige Ko   On: 05/11/2013 20:40   Ct Head Wo Contrast  05/13/2013   CLINICAL DATA:  Headache with nausea  EXAM: CT HEAD WITHOUT CONTRAST  TECHNIQUE: Contiguous axial images were obtained from the base of the skull through the vertex without intravenous contrast.  COMPARISON:  Prior CT from 05/11/2013  FINDINGS: Extensive age-related cerebral atrophy with chronic microvascular ischemic disease again seen common stable as compared to prior study. Encephalomalacia within the left parietal lobe again noted, consistent with remote infarct. No new large vessel territory infarct identified. There is no intracranial hemorrhage. No mass lesion or midline shift. No extra-axial fluid collection. Ventricles are normal in size without evidence of hydrocephalus.  Prominent vascular calcifications noted within the distal vertebral arteries as well as the carotid siphons.  Calvarium is intact. Orbits are normal. Few scattered opacities noted within the frontal sinuses bilaterally, unchanged. Paranasal sinuses are otherwise clear. No mastoid effusion.  IMPRESSION: 1. No acute intracranial process. 2. Stable atrophy with chronic microvascular ischemic disease with remote left parietal infarct.   Electronically Signed   By: Rise Mu M.D.   On: 05/13/2013 07:02   Ct Head Wo Contrast  05/11/2013   CLINICAL DATA:  Fatigue.  EXAM: CT HEAD WITHOUT CONTRAST  TECHNIQUE: Contiguous axial images were obtained from the base of the skull through the vertex  without intravenous contrast.  COMPARISON:  None.  FINDINGS: Periventricular white matter and corona  radiata hypodensities favor chronic ischemic microvascular white matter disease. Hypodensity in the left parietal lobe is somewhat confluent and favors a remote watershed infarct. I doubt that this is late subacute with regard to chronicity.  No intracranial hemorrhage or characteristic findings of acute infarct. No mass lesion observed. There is dense atherosclerotic calcification in the carotid siphons.  IMPRESSION: 1. Chronic microvascular disease, with suspected old left watershed infarct. No acute intracranial findings observed.   Electronically Signed   By: Herbie Baltimore M.D.   On: 05/11/2013 20:40   Dg Chest Port 1 View  05/13/2013   CLINICAL DATA:  Shortness of breath.  Follow-up edema.  EXAM: PORTABLE CHEST - 1 VIEW  COMPARISON:  05/11/2013 and 03/21/2013.  FINDINGS: 0733 hr. The heart size and mediastinal contours are stable status post CABG. There is increased diffuse interstitial prominence with patchy left greater than right basilar airspace opacities. In addition, there is peripheral density in the upper right hemithorax which is new from 2 months ago. This may reflect partially loculated pleural fluid. No pneumothorax is identified.  IMPRESSION: Increased interstitial and basilar pulmonary opacities suspicious for worsening edema. There is possible partially loculated pleural fluid on the right.   Electronically Signed   By: Roxy Horseman M.D.   On: 05/13/2013 10:28    Kela Millin, MD  [facility] Pager:[phone]  If 7PM-7AM, please contact night-coverage [email or web address] Password [email or web address] 05/19/2013, 1:14 PM   LOS: 8 days   Attending Patient seen and examined at,agree with the above assessment and plan. Continues to do well, now suspected to be esrdin the waiting for a skilled nursing facility bed.  Windell Norfolk MD

## 2013-06-05 ENCOUNTER — Encounter (HOSPITAL_COMMUNITY): Payer: Self-pay | Admitting: Emergency Medicine

## 2013-06-05 ENCOUNTER — Inpatient Hospital Stay (HOSPITAL_COMMUNITY)
Admission: EM | Admit: 2013-06-05 | Discharge: 2013-06-08 | DRG: 314 | Disposition: A | Discharge status: Hospice | Payer: Medicare Other | Attending: Internal Medicine | Admitting: Internal Medicine

## 2013-06-05 DIAGNOSIS — T8249XA Other complication of vascular dialysis catheter, initial encounter: Secondary | ICD-10-CM

## 2013-06-05 DIAGNOSIS — R410 Disorientation, unspecified: Secondary | ICD-10-CM

## 2013-06-05 DIAGNOSIS — Z87891 Personal history of nicotine dependence: Secondary | ICD-10-CM

## 2013-06-05 DIAGNOSIS — I2510 Atherosclerotic heart disease of native coronary artery without angina pectoris: Secondary | ICD-10-CM | POA: Diagnosis present

## 2013-06-05 DIAGNOSIS — D631 Anemia in chronic kidney disease: Secondary | ICD-10-CM | POA: Diagnosis present

## 2013-06-05 DIAGNOSIS — G9340 Encephalopathy, unspecified: Secondary | ICD-10-CM

## 2013-06-05 DIAGNOSIS — Z8673 Personal history of transient ischemic attack (TIA), and cerebral infarction without residual deficits: Secondary | ICD-10-CM

## 2013-06-05 DIAGNOSIS — M1990 Unspecified osteoarthritis, unspecified site: Secondary | ICD-10-CM | POA: Diagnosis present

## 2013-06-05 DIAGNOSIS — G9341 Metabolic encephalopathy: Secondary | ICD-10-CM

## 2013-06-05 DIAGNOSIS — K219 Gastro-esophageal reflux disease without esophagitis: Secondary | ICD-10-CM | POA: Diagnosis present

## 2013-06-05 DIAGNOSIS — Y841 Kidney dialysis as the cause of abnormal reaction of the patient, or of later complication, without mention of misadventure at the time of the procedure: Secondary | ICD-10-CM | POA: Diagnosis present

## 2013-06-05 DIAGNOSIS — F3289 Other specified depressive episodes: Secondary | ICD-10-CM | POA: Diagnosis present

## 2013-06-05 DIAGNOSIS — N039 Chronic nephritic syndrome with unspecified morphologic changes: Secondary | ICD-10-CM

## 2013-06-05 DIAGNOSIS — Z9861 Coronary angioplasty status: Secondary | ICD-10-CM

## 2013-06-05 DIAGNOSIS — F329 Major depressive disorder, single episode, unspecified: Secondary | ICD-10-CM | POA: Diagnosis present

## 2013-06-05 DIAGNOSIS — Z79899 Other long term (current) drug therapy: Secondary | ICD-10-CM

## 2013-06-05 DIAGNOSIS — F0390 Unspecified dementia without behavioral disturbance: Secondary | ICD-10-CM | POA: Diagnosis present

## 2013-06-05 DIAGNOSIS — R627 Adult failure to thrive: Secondary | ICD-10-CM | POA: Diagnosis present

## 2013-06-05 DIAGNOSIS — I1311 Hypertensive heart and chronic kidney disease without heart failure, with stage 5 chronic kidney disease, or end stage renal disease: Secondary | ICD-10-CM | POA: Diagnosis present

## 2013-06-05 DIAGNOSIS — N184 Chronic kidney disease, stage 4 (severe): Secondary | ICD-10-CM

## 2013-06-05 DIAGNOSIS — I252 Old myocardial infarction: Secondary | ICD-10-CM

## 2013-06-05 DIAGNOSIS — Z66 Do not resuscitate: Secondary | ICD-10-CM | POA: Diagnosis present

## 2013-06-05 DIAGNOSIS — N186 End stage renal disease: Secondary | ICD-10-CM

## 2013-06-05 DIAGNOSIS — D638 Anemia in other chronic diseases classified elsewhere: Secondary | ICD-10-CM

## 2013-06-05 DIAGNOSIS — T82598A Other mechanical complication of other cardiac and vascular devices and implants, initial encounter: Secondary | ICD-10-CM

## 2013-06-05 DIAGNOSIS — E039 Hypothyroidism, unspecified: Secondary | ICD-10-CM

## 2013-06-05 DIAGNOSIS — F29 Unspecified psychosis not due to a substance or known physiological condition: Secondary | ICD-10-CM

## 2013-06-05 DIAGNOSIS — E785 Hyperlipidemia, unspecified: Secondary | ICD-10-CM | POA: Diagnosis present

## 2013-06-05 DIAGNOSIS — I119 Hypertensive heart disease without heart failure: Secondary | ICD-10-CM

## 2013-06-05 DIAGNOSIS — T82898A Other specified complication of vascular prosthetic devices, implants and grafts, initial encounter: Principal | ICD-10-CM | POA: Diagnosis present

## 2013-06-05 DIAGNOSIS — Z7982 Long term (current) use of aspirin: Secondary | ICD-10-CM

## 2013-06-05 DIAGNOSIS — Z515 Encounter for palliative care: Secondary | ICD-10-CM

## 2013-06-05 DIAGNOSIS — Z951 Presence of aortocoronary bypass graft: Secondary | ICD-10-CM

## 2013-06-05 DIAGNOSIS — Z992 Dependence on renal dialysis: Secondary | ICD-10-CM

## 2013-06-05 LAB — COMPREHENSIVE METABOLIC PANEL
ALT: 18 U/L (ref 0–53)
AST: 26 U/L (ref 0–37)
Albumin: 3 g/dL — ABNORMAL LOW (ref 3.5–5.2)
Alkaline Phosphatase: 112 U/L (ref 39–117)
BUN: 63 mg/dL — ABNORMAL HIGH (ref 6–23)
CHLORIDE: 97 meq/L (ref 96–112)
CO2: 32 meq/L (ref 19–32)
CREATININE: 8.06 mg/dL — AB (ref 0.50–1.35)
Calcium: 9.3 mg/dL (ref 8.4–10.5)
GFR calc Af Amer: 6 mL/min — ABNORMAL LOW (ref >90)
GFR, EST NON AFRICAN AMERICAN: 5 mL/min — AB (ref >90)
GLUCOSE: 126 mg/dL — AB (ref 70–99)
Potassium: 4.4 mEq/L (ref 3.7–5.3)
Sodium: 145 mEq/L (ref 137–147)
Total Bilirubin: 0.4 mg/dL (ref 0.3–1.2)
Total Protein: 8.2 g/dL (ref 6.0–8.3)

## 2013-06-05 LAB — CBC
HCT: 36.2 % — ABNORMAL LOW (ref 39.0–52.0)
Hemoglobin: 12.3 g/dL — ABNORMAL LOW (ref 13.0–17.0)
MCH: 32.8 pg (ref 26.0–34.0)
MCHC: 34 g/dL (ref 30.0–36.0)
MCV: 96.5 fL (ref 78.0–100.0)
Platelets: 251 10*3/uL (ref 150–400)
RBC: 3.75 MIL/uL — AB (ref 4.22–5.81)
RDW: 13.4 % (ref 11.5–15.5)
WBC: 13.2 10*3/uL — AB (ref 4.0–10.5)

## 2013-06-05 LAB — GLUCOSE, CAPILLARY: Glucose-Capillary: 100 mg/dL — ABNORMAL HIGH (ref 70–99)

## 2013-06-05 LAB — CG4 I-STAT (LACTIC ACID): Lactic Acid, Venous: 2.03 mmol/L (ref 0.5–2.2)

## 2013-06-05 MED ORDER — SODIUM CHLORIDE 0.9 % IJ SOLN: 3.0000 mL | INTRAMUSCULAR | Status: DC | PRN

## 2013-06-05 MED ORDER — ASPIRIN EC 81 MG PO TBEC
81.0000 mg | DELAYED_RELEASE_TABLET | Freq: Every day | ORAL | Status: DC
  Administered 2013-06-06 – 2013-06-08 (×3): 81 mg via ORAL
  Filled 2013-06-05 (×3): qty 1

## 2013-06-05 MED ORDER — ADULT MULTIVITAMIN W/MINERALS CH
1.0000 | ORAL_TABLET | Freq: Every day | ORAL | Status: DC
  Filled 2013-06-05: qty 1

## 2013-06-05 MED ORDER — MORPHINE SULFATE 2 MG/ML IJ SOLN: 1.0000 mg | INTRAMUSCULAR | Status: DC | PRN

## 2013-06-05 MED ORDER — HYDROCODONE-ACETAMINOPHEN 5-325 MG PO TABS: 1.0000 | ORAL_TABLET | ORAL | Status: DC | PRN

## 2013-06-05 MED ORDER — HEPARIN SODIUM (PORCINE) 5000 UNIT/ML IJ SOLN
5000.0000 [IU] | Freq: Three times a day (TID) | INTRAMUSCULAR | Status: DC
  Administered 2013-06-06 (×2): 5000 [IU] via SUBCUTANEOUS
  Filled 2013-06-05 (×4): qty 1

## 2013-06-05 MED ORDER — SODIUM CHLORIDE 0.9 % IV SOLN
INTRAVENOUS | Status: AC
  Administered 2013-06-05: 19:00:00 via INTRAVENOUS

## 2013-06-05 MED ORDER — ONDANSETRON HCL 4 MG/2ML IJ SOLN: 4.0000 mg | Freq: Four times a day (QID) | INTRAMUSCULAR | Status: DC | PRN

## 2013-06-05 MED ORDER — CALCIUM ACETATE 667 MG PO CAPS
667.0000 mg | ORAL_CAPSULE | Freq: Three times a day (TID) | ORAL | Status: DC
  Administered 2013-06-06 – 2013-06-08 (×7): 667 mg via ORAL
  Filled 2013-06-05 (×10): qty 1

## 2013-06-05 MED ORDER — MIRTAZAPINE 7.5 MG PO TABS
7.5000 mg | ORAL_TABLET | Freq: Every day | ORAL | Status: DC
  Administered 2013-06-06 – 2013-06-07 (×2): 7.5 mg via ORAL
  Filled 2013-06-05 (×4): qty 1

## 2013-06-05 MED ORDER — ACETAMINOPHEN 650 MG RE SUPP: 650.0000 mg | Freq: Four times a day (QID) | RECTAL | Status: DC | PRN

## 2013-06-05 MED ORDER — LEVOTHYROXINE SODIUM 112 MCG PO TABS
112.0000 ug | ORAL_TABLET | Freq: Every day | ORAL | Status: DC
  Administered 2013-06-06 – 2013-06-08 (×3): 112 ug via ORAL
  Filled 2013-06-05 (×4): qty 1

## 2013-06-05 MED ORDER — SODIUM CHLORIDE 0.9 % IV SOLN: 250.0000 mL | INTRAVENOUS | Status: DC | PRN

## 2013-06-05 MED ORDER — SERTRALINE HCL 25 MG PO TABS
25.0000 mg | ORAL_TABLET | Freq: Every day | ORAL | Status: DC
  Administered 2013-06-06 – 2013-06-08 (×3): 25 mg via ORAL
  Filled 2013-06-05 (×3): qty 1

## 2013-06-05 MED ORDER — DOCUSATE SODIUM 100 MG PO CAPS: 100.0000 mg | ORAL_CAPSULE | Freq: Two times a day (BID) | ORAL | Status: DC

## 2013-06-05 MED ORDER — ZOLPIDEM TARTRATE 5 MG PO TABS: 5.0000 mg | ORAL_TABLET | Freq: Every evening | ORAL | Status: DC | PRN

## 2013-06-05 MED ORDER — DOCUSATE SODIUM 100 MG PO CAPS
100.0000 mg | ORAL_CAPSULE | Freq: Two times a day (BID) | ORAL | Status: DC
  Administered 2013-06-06 – 2013-06-08 (×5): 100 mg via ORAL
  Filled 2013-06-05 (×6): qty 1

## 2013-06-05 MED ORDER — ONDANSETRON HCL 4 MG PO TABS: 4.0000 mg | ORAL_TABLET | Freq: Four times a day (QID) | ORAL | Status: DC | PRN

## 2013-06-05 MED ORDER — SODIUM CHLORIDE 0.9 % IJ SOLN
3.0000 mL | Freq: Two times a day (BID) | INTRAMUSCULAR | Status: DC
  Administered 2013-06-06 – 2013-06-07 (×2): 3 mL via INTRAVENOUS

## 2013-06-05 MED ORDER — ACETAMINOPHEN 325 MG PO TABS: 650.0000 mg | ORAL_TABLET | Freq: Four times a day (QID) | ORAL | Status: DC | PRN

## 2013-06-05 NOTE — H&P (Signed)
[facility]                                                                                    Patient Demographics  [name], is a 80 y.o. male  CSN: [id number]  MRN: [id number]  DOB - 1920/10/23  Admit Date - 06/05/2013  Outpatient Primary MD for the patient is Default, Provider, MD   With History of -  Past Medical History  Diagnosis Date  . Stroke   . CAD (coronary artery disease)     Prior CABG x 2 last in 2005    . Hypertensive heart disease without CHF   . Hyperlipidemia     not on any medications  . History of TIA (transient ischemic attack)   . Lumbar disc disease   . Osteoarthritis   . Hypertension     takes Amlodipine daily  . MI (myocardial infarction) 2005  . Shortness of breath     with exertion and sitting  . GERD (gastroesophageal reflux disease)     takes Omeprazole daily  . Chronic kidney disease (CKD), stage IV (severe)     ESRD  . History of blood transfusion     no abnormal reaction noted  . Hypothyroidism     takes Synthroid daily  . Hypothyroidism   . Depression     takes Zoloft daily      Past Surgical History  Procedure Laterality Date  . Coronary stent placement    . Hernia repair    . Joint replacement Left 04/2005  . Kidney cyst removal Right 08/2008  . Transurethral resection of prostate    . Cataract surgery Left 2012  . Cardiac catheterization  2003/2005    stents  . Coronary artery bypass graft  2005  . Coronary artery bypass graft    . Appendectomy    . Tonsillectomy    . Av fistula placement Right 08/21/2012    Procedure: ARTERIOVENOUS (AV) FISTULA CREATION;  Surgeon: Larina Earthly, MD;  Location: [facility] OR;  Service: Vascular;  Laterality: Right;    in for   Chief Complaint  Patient presents with  . Weight Loss  . Vascular Access Problem     HPI  [name]  is a 80 y.o. male, with past medical history significant for CVA coronary artery disease hyperlipidemia history of TIA an incision disease  on hemodialysis who was brought here from the dialysis unit after his right arm shunt was not working good and he could not have dialysis. Patient also was confused according to the family and get worse later sodium was brought here for evaluation. Nephrology was consulted. And there was a consultation for palliative care since the patient made comments about not restarting dialysis.     Review of Systems    In addition to the HPI above, No Fever-chills, No Headache, No changes with Vision or hearing, No problems swallowing food or Liquids, No Chest pain, Cough or Shortness of Breath, No Abdominal pain, No Nausea or Vommitting, Bowel movements are regular, No Blood in stool or Urine, No dysuria, No new skin rashes or bruises, No new joints pains-aches,  No new weakness, tingling,  numbness in any extremity, No recent weight gain or loss, No polyuria, polydypsia or polyphagia,   A full 10 point Review of Systems was done, except as stated above, all other Review of Systems were negative.   Social History History  Substance Use Topics  . Smoking status: Former Smoker    Types: Cigarettes    Quit date: 09/19/1957  . Smokeless tobacco: Never Used  . Alcohol Use: No     Family History History reviewed. No pertinent family history.   Prior to Admission medications   Medication Sig Start Date End Date Taking? Authorizing Provider  acetaminophen (TYLENOL) 500 MG tablet Take 500 mg by mouth daily as needed for pain.    Yes Historical Provider, MD  aspirin EC 81 MG tablet Take 81 mg by mouth daily.   Yes Historical Provider, MD  calcium acetate (PHOSLO) 667 MG capsule Take 1 capsule (667 mg total) by mouth 3 (three) times daily with meals. 05/19/13  Yes Wayne Joselyn Glassman Viyuoh, MD  docusate sodium 100 MG CAPS Take 100 mg by mouth 2 (two) times daily. 05/17/13  Yes Shanker Levora DredgeM Ghimire, MD  levothyroxine (SYNTHROID, LEVOTHROID) 112 MCG tablet Take 112 mcg by mouth daily.    Yes Historical  Provider, MD  mirtazapine (REMERON) 7.5 MG tablet Take 7.5 mg by mouth at bedtime.   Yes Historical Provider, MD  Multiple Vitamin (MULTIVITAMIN WITH MINERALS) TABS tablet Take 1 tablet by mouth daily.   Yes Historical Provider, MD  sertraline (ZOLOFT) 100 MG tablet Take 50 mg by mouth daily.   Yes Historical Provider, MD    Allergies  Allergen Reactions  . Codeine Other (See Comments)    Unknown reaction  . Morphine And Related     Delirium (per son, Nida BoatmanBrad)  . Nitroglycerin     Delrium? & headache, (per son, Nida BoatmanBrad)    Physical Exam  Vitals  Blood pressure 176/83, pulse 60, temperature 97.6 F (36.4 C), temperature source Oral, resp. rate 22, SpO2 97.00%.   1. General elderly white American male lying in bed in no acute distress  2. flat affect, Not Suicidal or Homicidal, Awake Alert, Oriented   3. No F.N deficits, ALL C.Nerves Intact, Strength 5/5 all 4 extremities, Sensation intact all 4 extremities, Plantars down going.  4. Ears and Eyes appear Normal, Conjunctivae clear, PERRLA. Moist Oral Mucosa.  5. Supple Neck, No JVD, No cervical lymphadenopathy appriciated, No Carotid Bruits.  6. Symmetrical Chest wall movement, Good air movement bilaterally, CTAB.  7. irregular heart rate, No Gallops, Rubs or Murmurs, No Parasternal Heave.  8. Positive Bowel Sounds, Abdomen Soft, Non tender, No organomegaly appriciated,No rebound -guarding or rigidity.  9.  No Cyanosis, Normal Skin Turgor, No Skin Rash or Bruise.  10. Good muscle tone,  joints appear normal , no effusions, Normal ROM.  11. No Palpable Lymph Nodes in Neck or Axillae    Data Review  CBC  Recent Labs Lab 06/05/13 1645  WBC 13.2*  HGB 12.3*  HCT 36.2*  PLT 251  MCV 96.5  MCH 32.8  MCHC 34.0  RDW 13.4   ------------------------------------------------------------------------------------------------------------------  Chemistries   Recent Labs Lab 06/05/13 1645  NA 145  K 4.4  CL 97  CO2 32   GLUCOSE 126*  BUN 63*  CREATININE 8.06*  CALCIUM 9.3  AST 26  ALT 18  ALKPHOS 112  BILITOT 0.4     Imaging results:    My personal review of EKG: Rhythm NSR, nonspecific ST and T-wave changes  Assessment & Plan  1. end-stage renal disease on hemodialysis     Favored is that Mrs. due to to carotid right arm axis     Nephrology consulted     Dialysis in a.m.     Patient received 8 hours of IV fluids  193ml/hr normal saline in the emergency room 2. Confusion/depression     Decreased his Zoloft to 25 mg daily Remeron was continued     Patient not sure if he was to continue with dialysis     CODE STATUS is still not decided so Full code for now     Palliative care is involved with the case already        DVT Prophylaxis Heparin   AM Labs Ordered, also please review Full Orders  Family Communication: Admission, patients condition and plan of care including tests being ordered have been discussed with the patient and son who indicate understanding and agree with the plan and Code Status.  Code Status full/however patient signed a most form which is not available at this moment  Disposition Plan: Back to assisted living facility  Time spent in minutes : 34 minutes  Condition GUARDED

## 2013-06-05 NOTE — ED Notes (Signed)
Dr.Lockwood at bedside  

## 2013-06-05 NOTE — ED Notes (Signed)
Per family member, pt is a dialysis pt last tx was Saturday. Pt went to get dialysis today and they stated fistula in right arm is clotted. They had conversation with pt last week regarding dc dialysis, unsure if pt wants to continue and have procedure done to fix the fistula. Pt having some confusion and generalized fatigue, weakness, not eating for several days.

## 2013-06-05 NOTE — ED Provider Notes (Signed)
CSN: [id number]     Arrival date & time 06/05/13  1432 History   First MD Initiated Contact with Patient 06/05/13 1712     Chief Complaint  Patient presents with  . Weight Loss  . Vascular Access Problem    HPI  Patient presents with familial concerns of altered mental status, fatigue, anorexia. Patient has multiple medical problems, including end-stage renal disease, for which she is recently started at dialysis. Patient's last dialysis was 4 days ago. Today, at a planned dialysis session he was found to be less interactive. Patient is here with family who notes that the patient has been less interactive for some time, with no evidence of bursts of lucidity, but in general he has had substantially less activity recently. Family note that the patient has had substantial weight loss, decreased by mouth intake for almost one month.  He is compliant with medication according to family.  05 caveat secondary to altered mental status.   Past Medical History  Diagnosis Date  . Stroke   . CAD (coronary artery disease)     Prior CABG x 2 last in 2005    . Hypertensive heart disease without CHF   . Hyperlipidemia     not on any medications  . History of TIA (transient ischemic attack)   . Lumbar disc disease   . Osteoarthritis   . Hypertension     takes Amlodipine daily  . MI (myocardial infarction) 2005  . Shortness of breath     with exertion and sitting  . GERD (gastroesophageal reflux disease)     takes Omeprazole daily  . Chronic kidney disease (CKD), stage IV (severe)     ESRD  . History of blood transfusion     no abnormal reaction noted  . Hypothyroidism     takes Synthroid daily  . Hypothyroidism   . Depression     takes Zoloft daily   Past Surgical History  Procedure Laterality Date  . Coronary stent placement    . Hernia repair    . Joint replacement Left 04/2005  . Kidney cyst removal Right 08/2008  . Transurethral resection of prostate    . Cataract surgery  Left 2012  . Cardiac catheterization  2003/2005    stents  . Coronary artery bypass graft  2005  . Coronary artery bypass graft    . Appendectomy    . Tonsillectomy    . Av fistula placement Right 08/21/2012    Procedure: ARTERIOVENOUS (AV) FISTULA CREATION;  Surgeon: Larina Earthly, MD;  Location: [facility] OR;  Service: Vascular;  Laterality: Right;   History reviewed. No pertinent family history. History  Substance Use Topics  . Smoking status: Former Smoker    Types: Cigarettes    Quit date: 09/19/1957  . Smokeless tobacco: Never Used  . Alcohol Use: No    Review of Systems  Unable to perform ROS: Patient nonverbal    Allergies  Codeine; Morphine and related; and Nitroglycerin  Home Medications   Current Outpatient Rx  Name  Route  Sig  Dispense  Refill  . acetaminophen (TYLENOL) 500 MG tablet   Oral   Take 500 mg by mouth daily as needed for pain.          Marland Kitchen aspirin EC 81 MG tablet   Oral   Take 81 mg by mouth daily.         . calcium acetate (PHOSLO) 667 MG capsule   Oral   Take 1 capsule (667  mg total) by mouth 3 (three) times daily with meals.         . docusate sodium 100 MG CAPS   Oral   Take 100 mg by mouth 2 (two) times daily.   10 capsule   0   . levothyroxine (SYNTHROID, LEVOTHROID) 112 MCG tablet   Oral   Take 112 mcg by mouth daily.          . mirtazapine (REMERON) 7.5 MG tablet   Oral   Take 7.5 mg by mouth at bedtime.         . Multiple Vitamin (MULTIVITAMIN WITH MINERALS) TABS tablet   Oral   Take 1 tablet by mouth daily.         . sertraline (ZOLOFT) 100 MG tablet   Oral   Take 50 mg by mouth daily.          BP 176/83  Pulse 60  Temp(Src) 97.6 F (36.4 C) (Oral)  Resp 22  SpO2 97% Physical Exam  Nursing note and vitals reviewed. Constitutional: He is oriented to person, place, and time. He appears well-developed. He has a sickly appearance. No distress.  Patient appears calm, is minimally interactive. He does answer  questions briefly, clearly, though after a delay  HENT:  Head: Normocephalic and atraumatic.  Mouth/Throat: Mucous membranes are dry.  Eyes: Conjunctivae and EOM are normal.  Cardiovascular: Normal rate and regular rhythm.   Pulmonary/Chest: Effort normal. No stridor. No respiratory distress.  Abdominal: He exhibits no distension.  Palpable pulsatile mass, nontender  Musculoskeletal: He exhibits no edema.  Neurological: He is alert and oriented to person, place, and time. He displays atrophy. He displays no tremor. No cranial nerve deficit.  Skin: Skin is warm and dry.  Psychiatric: He has a normal mood and affect. His speech is delayed. He is slowed and withdrawn. Cognition and memory are impaired.    ED Course  Procedures (including critical care time) Labs Review Labs Reviewed  CBC - Abnormal; Notable for the following:    WBC 13.2 (*)    RBC 3.75 (*)    Hemoglobin 12.3 (*)    HCT 36.2 (*)    All other components within normal limits  COMPREHENSIVE METABOLIC PANEL - Abnormal; Notable for the following:    Glucose, Bld 126 (*)    BUN 63 (*)    Creatinine, Ser 8.06 (*)    Albumin 3.0 (*)    GFR calc non Af Amer 5 (*)    GFR calc Af Amer 6 (*)    All other components within normal limits  GLUCOSE, CAPILLARY - Abnormal; Notable for the following:    Glucose-Capillary 100 (*)    All other components within normal limits  URINALYSIS, ROUTINE W REFLEX MICROSCOPIC   Imaging Review No results found.  EKG Interpretation    Date/Time:  Tuesday June 05 2013 15:21:01 EST Ventricular Rate:  84 PR Interval:  176 QRS Duration: 102 QT Interval:  378 QTC Calculation: 446 R Axis:   11 Text Interpretation:  Normal sinus rhythm Nonspecific ST and T wave abnormality Abnormal ECG Sinus rhythm Artifact ST-t wave abnormality Abnormal ekg Confirmed by Gerhard MunchLOCKWOOD, Charistopher Rumble  MD [phone]([id number]) on 06/05/2013 6:10:19 PM           After the initial evaluation I reviewed the patient's chart.  On  subsequent evaluation patient appears calm. I had a lengthy conversation with the patient's son and his wife about goals of care, possibility of reinitiating at dialysis.  They  are interested in palliative care consult in addition to continued attempts to establish access for dialysis.  I discussed the patient's case with his nephrologist, who will follow the patient's care.   MDM   1. Failure of hemodialysis access   2. Anemia of chronic disease   3. Chronic kidney disease (CKD), stage IV (severe)   4. Confusion    Patient presents with inability to obtain urinalysis today, and persistent fatigue/anorexia. On exam patient is medically stable, though he appears deconditioned.  Given the patient's age, comorbidities, generally poor condition, I had a lengthy conversation with the family about goals of care.  If they are interested in a palliative care consultation addition to concurrent pursuit of reestablishing dialysis.  The patient was admitted for further evaluation and management.    Gerhard Munch, MD 06/05/13 2007

## 2013-06-05 NOTE — ED Notes (Signed)
Phlebotomy at bedside.

## 2013-06-05 NOTE — ED Notes (Signed)
Attempted phlebotomy draw x2 without success.

## 2013-06-05 NOTE — ED Notes (Signed)
CBG 100 

## 2013-06-06 ENCOUNTER — Inpatient Hospital Stay (HOSPITAL_COMMUNITY): Payer: Medicare Other

## 2013-06-06 DIAGNOSIS — G9340 Encephalopathy, unspecified: Secondary | ICD-10-CM

## 2013-06-06 DIAGNOSIS — I119 Hypertensive heart disease without heart failure: Secondary | ICD-10-CM

## 2013-06-06 DIAGNOSIS — N186 End stage renal disease: Secondary | ICD-10-CM

## 2013-06-06 DIAGNOSIS — Z515 Encounter for palliative care: Secondary | ICD-10-CM

## 2013-06-06 DIAGNOSIS — E039 Hypothyroidism, unspecified: Secondary | ICD-10-CM

## 2013-06-06 LAB — BASIC METABOLIC PANEL
BUN: 70 mg/dL — AB (ref 6–23)
CALCIUM: 8.6 mg/dL (ref 8.4–10.5)
CO2: 29 mEq/L (ref 19–32)
CREATININE: 8.56 mg/dL — AB (ref 0.50–1.35)
Chloride: 101 mEq/L (ref 96–112)
GFR, EST AFRICAN AMERICAN: 6 mL/min — AB (ref >90)
GFR, EST NON AFRICAN AMERICAN: 5 mL/min — AB (ref >90)
GLUCOSE: 89 mg/dL (ref 70–99)
POTASSIUM: 4.5 meq/L (ref 3.7–5.3)
Sodium: 146 mEq/L (ref 137–147)

## 2013-06-06 LAB — CBC
HCT: 32.8 % — ABNORMAL LOW (ref 39.0–52.0)
HEMOGLOBIN: 11.1 g/dL — AB (ref 13.0–17.0)
MCH: 32.3 pg (ref 26.0–34.0)
MCHC: 33.8 g/dL (ref 30.0–36.0)
MCV: 95.3 fL (ref 78.0–100.0)
PLATELETS: 208 10*3/uL (ref 150–400)
RBC: 3.44 MIL/uL — ABNORMAL LOW (ref 4.22–5.81)
RDW: 13.3 % (ref 11.5–15.5)
WBC: 11.3 10*3/uL — ABNORMAL HIGH (ref 4.0–10.5)

## 2013-06-06 MED ORDER — IPRATROPIUM-ALBUTEROL 0.5-2.5 (3) MG/3ML IN SOLN: 3.0000 mL | RESPIRATORY_TRACT | Status: DC | PRN

## 2013-06-06 MED ORDER — ACETAMINOPHEN 325 MG PO TABS
650.0000 mg | ORAL_TABLET | Freq: Three times a day (TID) | ORAL | Status: DC
  Administered 2013-06-06 – 2013-06-08 (×5): 650 mg via ORAL
  Filled 2013-06-06 (×5): qty 2

## 2013-06-06 MED ORDER — AMLODIPINE BESYLATE 5 MG PO TABS
5.0000 mg | ORAL_TABLET | Freq: Every day | ORAL | Status: DC
  Administered 2013-06-06 – 2013-06-08 (×3): 5 mg via ORAL
  Filled 2013-06-06 (×3): qty 1

## 2013-06-06 MED ORDER — LORAZEPAM 2 MG/ML IJ SOLN: 0.2500 mg | INTRAMUSCULAR | Status: DC | PRN

## 2013-06-06 MED ORDER — LABETALOL HCL 200 MG PO TABS
200.0000 mg | ORAL_TABLET | Freq: Two times a day (BID) | ORAL | Status: DC
  Administered 2013-06-06 – 2013-06-08 (×5): 200 mg via ORAL
  Filled 2013-06-06 (×6): qty 1

## 2013-06-06 MED ORDER — CAMPHOR-MENTHOL 0.5-0.5 % EX LOTN: TOPICAL_LOTION | CUTANEOUS | Status: DC | PRN

## 2013-06-06 MED ORDER — BISACODYL 10 MG RE SUPP: 10.0000 mg | Freq: Every day | RECTAL | Status: DC | PRN

## 2013-06-06 MED ORDER — FENTANYL CITRATE 0.05 MG/ML IJ SOLN: 12.5000 ug | INTRAMUSCULAR | Status: DC | PRN

## 2013-06-06 NOTE — Consult Note (Signed)
[facility]  Date: 06/06/2013   Patient Name: [name]  DOB: Nov 27, 1920  MRN: [id number]  Age / Sex: 80 y.o., male   PCP: Provider Default, MD Referring Physician: Rodolph Bonganiel V Thompson, MD  HPI/Reason for Consultation: 80 yo man with ESRD and dementia who started HD 3 weeks ago-it has not gone well. He continues to be encephalopathic and severely debilitated. PMT consult for goals of care. Renal service has advised against continued HD.  Participants in Discussion: Patient's wife and two sons  Goals/Summary of Discussion:  1. Code Status:  DNR  2. Scope of Treatment:  Full comfort care  3. Assessment/Plan:  Primary Diagnoses  1. ESRD, stopping HD  Prognosis: 5=-7 days PPS 30  Active Symptoms 1. Dyspnea 2. Agitation  4. Palliative Prophylaxis:   Bowel Regimen   Terminal Secretions  Breakthrough Pain and Dyspnea   Agitation and Delirium  Nausea  5. Psychosocial Spiritual Asssessment/Interventions:  Patient and Family Adjustment to Illness/Prognosis: coping well, wife had serious illness as well  Spiritual Concerns or Needs: none expressed  6. Disposition: Recommend Hospice Facility  ROS:  Unable to obtain from patient Social History:   reports that he quit smoking about 55 years ago. His smoking use included Cigarettes. He smoked 0.00 packs per day. He has never used smokeless tobacco. He reports that he does not drink alcohol or use illicit drugs. Living Situation: SNF Occupation: retired   Family History: History reviewed. No pertinent family history.  Active Medications:  Outpatient medications: Prescriptions prior to admission  Medication Sig Dispense Refill  . acetaminophen (TYLENOL) 500 MG tablet Take 500 mg by mouth daily as needed for pain.       Marland Kitchen. aspirin EC 81 MG tablet Take 81 mg by mouth daily.      . calcium acetate (PHOSLO) 667 MG capsule Take 1 capsule (667 mg total) by mouth 3 (three) times daily with  meals.      . docusate sodium 100 MG CAPS Take 100 mg by mouth 2 (two) times daily.  10 capsule  0  . levothyroxine (SYNTHROID, LEVOTHROID) 112 MCG tablet Take 112 mcg by mouth daily.       . mirtazapine (REMERON) 7.5 MG tablet Take 7.5 mg by mouth at bedtime.      . Multiple Vitamin (MULTIVITAMIN WITH MINERALS) TABS tablet Take 1 tablet by mouth daily.      . sertraline (ZOLOFT) 100 MG tablet Take 50 mg by mouth daily.        Current medications: Infusions:    Scheduled Medications: . acetaminophen  650 mg Oral TID  . amLODipine  5 mg Oral Daily  . aspirin EC  81 mg Oral Daily  . calcium acetate  667 mg Oral TID WC  . docusate sodium  100 mg Oral BID  . labetalol  200 mg Oral BID  . levothyroxine  112 mcg Oral QAC breakfast  . mirtazapine  7.5 mg Oral QHS  . sertraline  25 mg Oral Daily  . sodium chloride  3 mL Intravenous Q12H    PRN Medications: sodium chloride, bisacodyl, camphor-menthol, fentaNYL, ipratropium-albuterol, LORazepam, ondansetron (ZOFRAN) IV, ondansetron, sodium chloride   Vital Signs: BP 160/80  Pulse 73  Temp(Src) 98.1 F (36.7 C) (Oral)  Resp 20  Ht 5\' 9"  (1.753 m)  Wt 59.829 kg (131 lb 14.4 oz)  BMI 19.47 kg/m2  SpO2 98%   Physical Exam:  General appearance: NAD, minimally conversant  Eyes: anicteric sclerae,  moist conjunctivae; no lid-lag; PERRLA HENT: Atraumatic; oropharynx dry with moist mucous membranes and no mucosal ulcerations; normal hard and soft palate Neck: Trachea midline; FROM, supple, no thyromegaly or lymphadenopathy Lungs: Course rhonchi CV: Irregular, Tachy Abdomen: Soft, non-tender; no masses or HSM Extremities: +edema Skin: bruising Psych: Agitated, confused  Labs:  Basic or Comprehensive Metabolic Panel:    Component Value Date/Time   NA 146 06/06/2013 0650   K 4.5 06/06/2013 0650   CL 101 06/06/2013 0650   CO2 29 06/06/2013 0650   BUN 70* 06/06/2013 0650   CREATININE 8.56* 06/06/2013 0650   GLUCOSE 89 06/06/2013 0650    CALCIUM 8.6 06/06/2013 0650   AST 26 06/05/2013 1645   ALT 18 06/05/2013 1645   ALKPHOS 112 06/05/2013 1645   BILITOT 0.4 06/05/2013 1645   PROT 8.2 06/05/2013 1645   ALBUMIN 3.0* 06/05/2013 1645     CBC:    Component Value Date/Time   WBC 11.3* 06/06/2013 0650   HGB 11.1* 06/06/2013 0650   HCT 32.8* 06/06/2013 0650   PLT 208 06/06/2013 0650   MCV 95.3 06/06/2013 0650   NEUTROABS 7.0 05/11/2013 1950   LYMPHSABS 1.4 05/11/2013 1950   MONOABS 0.8 05/11/2013 1950   EOSABS 0.3 05/11/2013 1950   BASOSABS 0.0 05/11/2013 1950     BNP (last 3 results) No results found for this basename: PROBNP,  in the last 8760 hours  CBG (last 3)   Recent Labs  06/05/13 1736  GLUCAP 100*    Imaging:  Dg Chest Port 1 View  06/06/2013   CLINICAL DATA:  Altered mental status.  Cough.  EXAM: PORTABLE CHEST - 1 VIEW  COMPARISON:  05/13/2013  FINDINGS: Pulmonary opacities noted previously have resolved. Lungs are clear. Changes from CABG surgery are stable. Cardiac silhouette is normal in size. Mediastinum and hilar contours are unremarkable.  IMPRESSION: No acute cardiopulmonary disease.   Electronically Signed   By: Amie Portland M.D.   On: 06/06/2013 13:41    Other Data:  (2D echo, EKG...)   Educational Materials Given:  Hard Choices Book   Time: 70 minutes Greater than 50%  of this time was spent counseling and coordinating care related to the above assessment and plan.  Signed by: Edsel Petrin, DO  06/06/2013, 6:08 PM  Please contact Palliative Medicine Team phone at [phone] for questions and concerns.

## 2013-06-06 NOTE — Progress Notes (Signed)
Met with patient, his wife and two sons re: goals of care. Full consult to follow. 1. DNR 2. Full Comfort 3. [place] with Hospice and paid 24/7 in home care givers-weighing options. This patient is not appropriate for SNF, will likely need escalation of comfort medication including IV infusion meds. He has morphine reaction associated with hallucinations and agitation- will use Fentanyl given his renal failure.  Prognosis: <2 weeks, discontinuing hemodialysis  Lane Hacker, DO Palliative Medicine

## 2013-06-06 NOTE — Consult Note (Signed)
Renal Service Consult Note [name]  Wayne NanasSamuel W [name] 06/06/2013 Wayne MetzSCHERTZ,[name] Requesting Physician:  Dr Janee Mornhompson   Reason for Consult:  Recent dialysis started, now pt may want to stop HD, also has clotted AV fistula HPI: The patient is a 80 y.o. year-old with hx of HTN, CVA, mild dementia, and CKD who started dialysis about 3 wks ago due to worsening confusion and gen'[name] weakness. Pt now presents with clotted AVF and has made statements on 2 or 3 occassions in the past week that he might not want to continue dialysis. Pt admitted yest with these issues.   Patient is a vague historian, has no idea why he is here. The son says that his confusion is worse today than it has been recently.  No HA, blurred vision or focal weakness.   Labs show creat 8 now, it was 3-5 range when HD was started. He has not been missing treatments  CXR is negative, WBC 11.3, Temp 98.6, BP 160/80  ROS  no n/v/[name]  no abd pain  no sob or cough  no fevers  Past Medical History  Past Medical History  Diagnosis Date  . Stroke   . CAD (coronary artery disease)     Prior CABG x 2 last in 2005    . Hypertensive heart disease without CHF   . Hyperlipidemia     not on any medications  . History of TIA (transient ischemic attack)   . Lumbar disc disease   . Osteoarthritis   . Hypertension     takes Amlodipine daily  . MI (myocardial infarction) 2005  . Shortness of breath     with exertion and sitting  . GERD (gastroesophageal reflux disease)     takes Omeprazole daily  . Chronic kidney disease (CKD), stage IV (severe)     ESRD  . History of blood transfusion     no abnormal reaction noted  . Hypothyroidism     takes Synthroid daily  . Hypothyroidism   . Depression     takes Zoloft daily   Past Surgical History  Past Surgical History  Procedure Laterality Date  . Coronary stent placement    . Hernia repair    . Joint replacement Left 04/2005  . Kidney cyst removal Right 08/2008   . Transurethral resection of prostate    . Cataract surgery Left 2012  . Cardiac catheterization  2003/2005    stents  . Coronary artery bypass graft  2005  . Coronary artery bypass graft    . Appendectomy    . Tonsillectomy    . Av fistula placement Right 08/21/2012    Procedure: ARTERIOVENOUS (AV) FISTULA CREATION;  Surgeon: Larina Earthlyodd F Early, MD;  [facility];  Service: Vascular;  Laterality: Right;   Family History History reviewed. No pertinent family history. Social History  reports that he quit smoking about 55 years ago. His smoking use included Cigarettes. He smoked 0.00 packs per day. He has never used smokeless tobacco. He reports that he does not drink alcohol or use illicit drugs. Allergies  Allergies  Allergen Reactions  . Codeine Other (See Comments)    Unknown reaction  . Morphine And Related     Delirium (per son, [name])  . Nitroglycerin     Delrium? & headache, (per son, [name])   Home medications Prior to Admission medications   Medication Sig Start Date End Date Taking? Authorizing Provider  acetaminophen (TYLENOL) 500 MG tablet Take 500 mg by mouth daily  as needed for pain.    Yes Historical Provider, MD  aspirin EC 81 MG tablet Take 81 mg by mouth daily.   Yes Historical Provider, MD  calcium acetate (PHOSLO) 667 MG capsule Take 1 capsule (667 mg total) by mouth 3 (three) times daily with meals. 05/19/13  Yes Wayne Joselyn Glassman, MD  docusate sodium 100 MG CAPS Take 100 mg by mouth 2 (two) times daily. 05/17/13  Yes Wayne Levora Dredge, MD  levothyroxine (SYNTHROID, LEVOTHROID) 112 MCG tablet Take 112 mcg by mouth daily.    Yes Historical Provider, MD  mirtazapine (REMERON) 7.5 MG tablet Take 7.5 mg by mouth at bedtime.   Yes Historical Provider, MD  Multiple Vitamin (MULTIVITAMIN WITH MINERALS) TABS tablet Take 1 tablet by mouth daily.   Yes Historical Provider, MD  sertraline (ZOLOFT) 100 MG tablet Take 50 mg by mouth daily.   Yes Historical Provider, MD   Liver  Function Tests  Recent Labs Lab 06/05/13 1645  AST 26  ALT 18  ALKPHOS 112  BILITOT 0.4  PROT 8.2  ALBUMIN 3.0*   No results found for this basename: LIPASE, AMYLASE,  in the last 168 hours CBC  Recent Labs Lab 06/05/13 1645 06/06/13 0650  WBC 13.2* 11.3*  HGB 12.3* 11.1*  HCT 36.2* 32.8*  MCV 96.5 95.3  PLT 251 208   Basic Metabolic Panel  Recent Labs Lab 06/05/13 1645 06/06/13 0650  NA 145 146  K 4.4 4.5  CL 97 101  CO2 32 29  GLUCOSE 126* 89  BUN 63* 70*  CREATININE 8.06* 8.56*  CALCIUM 9.3 8.6    Exam  Blood pressure 160/80, pulse 73, temperature 98.1 F (36.7 C), temperature source Oral, resp. rate 20, height 5\' 9"  (1.753 m), weight 59.829 kg (131 lb 14.4 oz), SpO2 98.00%. Frail elderly male, no distress, confused, weak No rash, cyanosis or gangrene Sclera anicteric, throat clear No JVD Chest occ rhonchi, no rales or wheezing, good air movement RRR no MRG Abd soft, scaphoid, no ascites or HSM No LE or UE edema RUA AVF (placed 08/2012) with bruising and no bruit or thrill Neuro w gen weakness, nonambulatory, no focal deficit, interacts verbally but oriented to person only  Dialysis: [facility] 4h  F180  2K/2.25 [place]  59.5kg   350/800   RUA AVF (Apr 2014)  Heparin 1800  Venofer 50mg /wk   EPO/Hectorol none  Assessment 1. ESRD, recent start 3 wks ago 2. Altered mental status, acute on chronic worsening w FTT and severe fatigue 3. Clotted AV fistula 4. Dementia 5. Hx CVA 6. CAD, hx MI 7. HTN 8. Depression- on 2 antidepressants 9. 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 HTN/volume- no BP meds, BP slightly high, no vol excess, euvolemic on exam and at dry wt today   Plan- He is doing poorly on dialysis and I don't see a remediable explanation, meds and labs all reviewed. I think he will likely continue to do poorly with or without hemodialysis, and have expressed this to the family members today. They are going to discuss matters this evening and may also be meeting  with pall care team.  Will f/u in am.  No urgent need for HD at this time.  Wayne Moselle MD (pgr) [phone]    ([phone] 06/06/2013, 3:52 PM

## 2013-06-06 NOTE — Progress Notes (Signed)
Nurse and NT attempted I&O catheterization, resistance was met with both attempts. No output resulted. Will try again at later time.

## 2013-06-06 NOTE — Progress Notes (Signed)
SLP Cancellation Note  Patient Details Name: Wayne NanasSamuel W Weiher MRN: [id number] DOB: August 27, 1920   Cancelled treatment:         Attempted to see pt. For swallow evaluation.  Wife at bedside stated, "Good luck with that."  Pt. Closed eyes and turned away.  Discussed with Dr. Arlean HoppingSchertz who asked SLP to cancel, as pt. Is likely for Palliative care.  Recommend pt./family direct care re: types of po's desired for comfort feeds.   Maryjo RochesterWillis, Emilian Stawicki T 06/06/2013, 4:07 PM

## 2013-06-06 NOTE — Progress Notes (Addendum)
[facility] PROGRESS NOTE  GREIG ALTERGOTT ZOX:[id number] DOB: 1920/05/13 DOA: 06/05/2013 PCP: Default, Provider, MD  Assessment/Plan: #1 end-stage renal disease on hemodialysis Patient was brought to the ED from the dialysis unit after mild function of right upper extremity shunt and a such patient was unable to have dialysis. Continue PhosLo. Nephrology consultation pending. Will keep patient n.p.o. for now. Palliative care consultation pending. Per nephrology.  #2 acute encephalopathy Likely secondary to uremia secondary to problem #1. Patient recently hospitalized early on this month for metabolic encephalopathy which was felt to be secondary to uremia which improved after dialysis. At that time CT of the head was done which was negative. Will check a UA with cultures and sensitivities. Check a chest x-ray. Follow for now pending palliative care consultation. Nephrology also following to determine whether patient is to continue with dialysis pending palliative care meeting.  #3 hypothyroidism Continue Synthroid.  #4 anemia of chronic kidney disease Stable.  #5 depression On Zoloft.  #6 chronic CHF/coronary artery disease status post CABG Hemodynamically stable. Continue aspirin and beta blocker. Will check a chest x-ray. Follow.  #7 hypertension During last hospitalization patient was on labetalol 200 mg twice daily and Norvasc 5 mg daily will resume these.  #8 prophylaxis Heparin for DVT prophylaxis.  Code Status: Full Family Communication: Updated patient no family at bedside. Disposition Plan: Pending palliative care consultation and medical stability   Consultants:  Nephrology pending  Palliative care pending  Procedures:  None  Antibiotics:  None  HPI/Subjective: Patient sleeping but easily arousable. Patient oriented to self. Patient with no complaints.  Objective: Filed Vitals:   06/06/13 0515  BP: 170/67  Pulse: 70  Temp: 98.3 F (36.8 C)   Resp:     Intake/Output Summary (Last 24 hours) at 06/06/13 1038 Last data filed at 06/05/13 2300  Gross per 24 hour  Intake      0 ml  Output      0 ml  Net      0 ml   Filed Weights   06/05/13 2251  Weight: 59.829 kg (131 lb 14.4 oz)    Exam:   General:   nad  Cardiovascular:  rrr with 3/6 SEM  Respiratory: ctab  Abdomen: Soft, nontender, nondistended, positive bowel sounds  Musculoskeletal: No clubbing cyanosis or edema   Data Reviewed: Basic Metabolic Panel:  Recent Labs Lab 06/05/13 1645 06/06/13 0650  NA 145 146  K 4.4 4.5  CL 97 101  CO2 32 29  GLUCOSE 126* 89  BUN 63* 70*  CREATININE 8.06* 8.56*  CALCIUM 9.3 8.6   Liver Function Tests:  Recent Labs Lab 06/05/13 1645  AST 26  ALT 18  ALKPHOS 112  BILITOT 0.4  PROT 8.2  ALBUMIN 3.0*   No results found for this basename: LIPASE, AMYLASE,  in the last 168 hours No results found for this basename: AMMONIA,  in the last 168 hours CBC:  Recent Labs Lab 06/05/13 1645 06/06/13 0650  WBC 13.2* 11.3*  HGB 12.3* 11.1*  HCT 36.2* 32.8*  MCV 96.5 95.3  PLT 251 208   Cardiac Enzymes: No results found for this basename: CKTOTAL, CKMB, CKMBINDEX, TROPONINI,  in the last 168 hours BNP (last 3 results) No results found for this basename: PROBNP,  in the last 8760 hours CBG:  Recent Labs Lab 06/05/13 1736  GLUCAP 100*    No results found for this or any previous visit (from the past 240 hour(s)).   Studies: No results  found.  Scheduled Meds: . aspirin EC  81 mg Oral Daily  . calcium acetate  667 mg Oral TID WC  . docusate sodium  100 mg Oral BID  . heparin  5,000 Units Subcutaneous Q8H  . levothyroxine  112 mcg Oral QAC breakfast  . mirtazapine  7.5 mg Oral QHS  . multivitamin with minerals  1 tablet Oral Daily  . sertraline  25 mg Oral Daily  . sodium chloride  3 mL Intravenous Q12H   Continuous Infusions:   Principal Problem:   End stage renal disease Active Problems:    CAD (coronary artery disease)   Chronic kidney disease (CKD), stage IV (severe)   Hypothyroidism   Hyperlipidemia   Confusion   Acute encephalopathy    Time spent: 35 minutes    THOMPSON,DANIEL M.D. [facility] Pager [phone]. If 7PM-7AM, please contact night-coverage at [email or web address], password [email or web address] 06/06/2013, 10:38 AM  LOS: 1 day

## 2013-06-06 NOTE — Progress Notes (Signed)
Patient EA:[id number]:[name]      DOB: 07-20-1920      XBJ:[id number]  Palliative Medicine consult received.  We will get this scheduled as soon as possible.  Interim concerns can be called to the Team phone  Aracelia Brinson L. Ladona Ridgelaylor, MD MBA The Palliative Medicine Team at [facility] Team Phone: [phone] Pager: [phone]

## 2013-06-07 DIAGNOSIS — Z515 Encounter for palliative care: Secondary | ICD-10-CM

## 2013-06-07 NOTE — Progress Notes (Signed)
Nutrition Brief Note  Chart reviewed. Pt now transitioning to comfort care.  No further nutrition interventions warranted at this time.  Please re-consult as needed.   Braeton Wolgamott RD, LDN, CNSC [phone] Pager [phone] After Hours Pager    

## 2013-06-07 NOTE — Progress Notes (Signed)
Appreciate palliative care assistance with EOL issues.  Full comfort care now, no further hemodialysis. Will sign off.  Vinson Moselleob Electra Paladino MD (pgr) [phone]    ([phone]) [phone] 06/07/2013, 10:36 AM

## 2013-06-07 NOTE — Progress Notes (Signed)
Palliative Care Team at [facility] Progress Note   SUBJECTIVE: Patient very confused, but alert and taking sips of Boost. He can only respond to my questions by saying generalities-no complex decision making capabilities-cannot carry on conversation.  OBJECTIVE: Vital Signs: BP 148/68  Pulse 72  Temp(Src) 97.6 F (36.4 C) (Oral)  Resp 18  Ht 5\' 9"  (1.753 m)  Wt 59.829 kg (131 lb 14.4 oz)  BMI 19.47 kg/m2  SpO2 98%   Intake and Output:    Physical Exam: General: Vital signs reviewed and noted. Cooperative with examination. Very disheveled.  Head: Normocephalic, atraumatic.  Lungs:  Normal respiratory effort.+crackles no wheezes.  Heart: RRR. S1 and S2 normal without gallop,  or rubs. (+) murmur  Abdomen:  BS normoactive. Soft, Nondistended, non-tender.  No masses or organomegaly.  Extremities: + edema.    Allergies  Allergen Reactions  . Codeine Other (See Comments)    Unknown reaction  . Morphine And Related     Delirium (per son, Nida BoatmanBrad)  . Nitroglycerin     Delrium? & headache, (per son, Nida BoatmanBrad)    Medications: Scheduled Meds:  . acetaminophen  650 mg Oral TID  . amLODipine  5 mg Oral Daily  . aspirin EC  81 mg Oral Daily  . calcium acetate  667 mg Oral TID WC  . docusate sodium  100 mg Oral BID  . labetalol  200 mg Oral BID  . levothyroxine  112 mcg Oral QAC breakfast  . mirtazapine  7.5 mg Oral QHS  . sertraline  25 mg Oral Daily  . sodium chloride  3 mL Intravenous Q12H    Continuous Infusions:    PRN Meds: sodium chloride, bisacodyl, camphor-menthol, fentaNYL, ipratropium-albuterol, LORazepam, ondansetron (ZOFRAN) IV, ondansetron, sodium chloride  Labs: CBC    Component Value Date/Time   WBC 11.3* 06/06/2013 0650   RBC 3.44* 06/06/2013 0650   HGB 11.1* 06/06/2013 0650   HCT 32.8* 06/06/2013 0650   PLT 208 06/06/2013 0650   MCV 95.3 06/06/2013 0650   MCH 32.3 06/06/2013 0650   MCHC 33.8 06/06/2013 0650   RDW 13.3 06/06/2013 0650   LYMPHSABS 1.4  05/11/2013 1950   MONOABS 0.8 05/11/2013 1950   EOSABS 0.3 05/11/2013 1950   BASOSABS 0.0 05/11/2013 1950    CMET     Component Value Date/Time   NA 146 06/06/2013 0650   K 4.5 06/06/2013 0650   CL 101 06/06/2013 0650   CO2 29 06/06/2013 0650   GLUCOSE 89 06/06/2013 0650   BUN 70* 06/06/2013 0650   CREATININE 8.56* 06/06/2013 0650   CALCIUM 8.6 06/06/2013 0650   PROT 8.2 06/05/2013 1645   ALBUMIN 3.0* 06/05/2013 1645   AST 26 06/05/2013 1645   ALT 18 06/05/2013 1645   ALKPHOS 112 06/05/2013 1645   BILITOT 0.4 06/05/2013 1645   GFRNONAA 5* 06/06/2013 0650   GFRAA 6* 06/06/2013 0650    ASSESSMENT/ PLAN: Actively dying of ESRD, stopping HD. Currently encephalopathic, mild tremor.   [facility]  PRN Fentanyl IV until d/c for pain or dyspnea, symptom management will be  assumed by [facility] attending[facility]team after discharge  Provided support to family.    25 minutes, Greater than 50%  of this time was spent counseling and coordinating care related to the above assessment and plan.   Edsel PetrinElizabeth L Marcena Dias, DO  06/07/2013, 6:20 PM  Please contact Palliative Medicine Team phone at [phone] for questions and concerns.

## 2013-06-07 NOTE — Progress Notes (Signed)
Utilization review completed. Ninoska Goswick, RN, BSN. 

## 2013-06-07 NOTE — Progress Notes (Signed)
[facility] PROGRESS NOTE  [name] ZOX:[id number] DOB: 1920/05/31 DOA: 06/05/2013 PCP: Default, Provider, MD  Assessment/Plan: #1 end-stage renal disease on hemodialysis Patient was brought to the ED from the dialysis unit after mild function of right upper extremity shunt and a such patient was unable to have dialysis. Patient has been seen by palliative care and goal is comfort measures. HD has been discontinued. Renal ff.  #2 acute encephalopathy Likely secondary to uremia secondary to problem #1. Patient recently hospitalized early on this month for metabolic encephalopathy which was felt to be secondary to uremia which improved after dialysis. At that time CT of the head was done which was negative. UA negative. Chest x-ray negative. Patient with poor prognosis and focus on comfort care. HD cancelled.  #3 hypothyroidism Synthroid.  #4 anemia of chronic kidney disease Stable.  #5 depression On Zoloft.  #6 chronic CHF/coronary artery disease status post CABG Hemodynamically stable. Continue aspirin and beta blocker. Follow.  #7 hypertension Continue labetalol and Norvasc 5 mg daily will resume these.  #8 prophylaxis Heparin for DVT prophylaxis.  #9 prognosis Patient has been seen by palliative care for goals of care. Patient with dementia presented with acute encephalopathy which could be likely secondary to worsening dementia versus uremia. Patient has been seen by palliative care and goals of care is full for comfort, DO NOT RESUSCITATE, hospice home versus home with hospice. Palliative care is managing patient's symptoms and has been recommended to discontinue hemodialysis. Patient will be discharged to hospice home on bed is available.   Code Status: Full Family Communication: Updated patient no family at bedside. Disposition Plan: Hospice home versus home with hospice.   Consultants:  Nephrology: Dr Arta SilenceShertz 06/06/13  Palliative care: Dr Phillips OdorGolding  06/06/13  Procedures:  None  Antibiotics:  None  HPI/Subjective: Patient sleeping but easily arousable. Patient oriented to self only. Patient with no complaints.  Objective: Filed Vitals:   06/07/13 0934  BP: 185/62  Pulse: 60  Temp:   Resp:     Intake/Output Summary (Last 24 hours) at 06/07/13 0947 Last data filed at 06/06/13 1900  Gross per 24 hour  Intake      0 ml  Output      0 ml  Net      0 ml   Filed Weights   06/05/13 2251  Weight: 59.829 kg (131 lb 14.4 oz)    Exam:   General:   nad  Cardiovascular:  rrr with 3/6 SEM  Respiratory: ctab  Abdomen: Soft, nontender, nondistended, positive bowel sounds  Musculoskeletal: No clubbing cyanosis or edema   Data Reviewed: Basic Metabolic Panel:  Recent Labs Lab 06/05/13 1645 06/06/13 0650  NA 145 146  K 4.4 4.5  CL 97 101  CO2 32 29  GLUCOSE 126* 89  BUN 63* 70*  CREATININE 8.06* 8.56*  CALCIUM 9.3 8.6   Liver Function Tests:  Recent Labs Lab 06/05/13 1645  AST 26  ALT 18  ALKPHOS 112  BILITOT 0.4  PROT 8.2  ALBUMIN 3.0*   No results found for this basename: LIPASE, AMYLASE,  in the last 168 hours No results found for this basename: AMMONIA,  in the last 168 hours CBC:  Recent Labs Lab 06/05/13 1645 06/06/13 0650  WBC 13.2* 11.3*  HGB 12.3* 11.1*  HCT 36.2* 32.8*  MCV 96.5 95.3  PLT 251 208   Cardiac Enzymes: No results found for this basename: CKTOTAL, CKMB, CKMBINDEX, TROPONINI,  in the last 168 hours BNP (  last 3 results) No results found for this basename: PROBNP,  in the last 8760 hours CBG:  Recent Labs Lab 06/05/13 1736  GLUCAP 100*    No results found for this or any previous visit (from the past 240 hour(s)).   Studies: Dg Chest Port 1 View  06/06/2013   CLINICAL DATA:  Altered mental status.  Cough.  EXAM: PORTABLE CHEST - 1 VIEW  COMPARISON:  05/13/2013  FINDINGS: Pulmonary opacities noted previously have resolved. Lungs are clear. Changes from CABG  surgery are stable. Cardiac silhouette is normal in size. Mediastinum and hilar contours are unremarkable.  IMPRESSION: No acute cardiopulmonary disease.   Electronically Signed   By: Amie Portland M.D.   On: 06/06/2013 13:41    Scheduled Meds: . acetaminophen  650 mg Oral TID  . amLODipine  5 mg Oral Daily  . aspirin EC  81 mg Oral Daily  . calcium acetate  667 mg Oral TID WC  . docusate sodium  100 mg Oral BID  . labetalol  200 mg Oral BID  . levothyroxine  112 mcg Oral QAC breakfast  . mirtazapine  7.5 mg Oral QHS  . sertraline  25 mg Oral Daily  . sodium chloride  3 mL Intravenous Q12H   Continuous Infusions:   Principal Problem:   End stage renal disease Active Problems:   CAD (coronary artery disease)   Chronic kidney disease (CKD), stage IV (severe)   Hypothyroidism   Hyperlipidemia   Confusion   Acute encephalopathy    Time spent: 35 minutes    Shadasia Oldfield M.D. [facility] Pager [phone]. If 7PM-7AM, please contact night-coverage at [email or web address], password [facility] 06/07/2013, 9:47 AM  LOS: 2 days

## 2013-06-08 ENCOUNTER — Encounter (HOSPITAL_COMMUNITY): Payer: Self-pay | Admitting: *Deleted

## 2013-06-08 MED ORDER — ONDANSETRON HCL 4 MG PO TABS: 4.0000 mg | ORAL_TABLET | Freq: Four times a day (QID) | ORAL | Status: AC | PRN

## 2013-06-08 MED ORDER — LABETALOL HCL 200 MG PO TABS: 200.0000 mg | ORAL_TABLET | Freq: Two times a day (BID) | ORAL | Status: AC

## 2013-06-08 MED ORDER — LORAZEPAM 0.5 MG PO TABS: 0.5000 mg | ORAL_TABLET | Freq: Three times a day (TID) | ORAL | Status: AC

## 2013-06-08 MED ORDER — AMLODIPINE BESYLATE 5 MG PO TABS: 5.0000 mg | ORAL_TABLET | Freq: Every day | ORAL | Status: AC

## 2013-06-08 MED ORDER — SENNOSIDES-DOCUSATE SODIUM 8.6-50 MG PO TABS: 1.0000 | ORAL_TABLET | Freq: Every day | ORAL | Status: AC

## 2013-06-08 MED ORDER — CAMPHOR-MENTHOL 0.5-0.5 % EX LOTN: TOPICAL_LOTION | CUTANEOUS | Status: AC | PRN

## 2013-06-08 NOTE — Progress Notes (Signed)
Clinical social worker assisted with [facility] discharge [facility],.  CSW addressed all family questions and concerns. CSW copied chart and added all important documents. CSW also set up patient transportation with [facility]. Clinical Social Worker will sign off for now as social work intervention is no longer needed.   Sabino NiemannAmy Jasime Westergren, MSW, [employer]CSWA [phone]

## 2013-06-08 NOTE — Consult Note (Signed)
[facility] Liaison: [place] room available for [name] this morning. Met with family yesterday to complete registration paper work for transfer today. Dr. Orpah Melter to assume care at [facility]. Will need DC summary faxed to [phone] and RN to call report to [phone]. Thank you. Erling Conte LCSW [phone]

## 2013-06-08 NOTE — Discharge Summary (Signed)
Physician Discharge Summary  Wayne NanasSamuel W Cerutti WUJ:[id number]:[id number] DOB: 10/10/1920 DOA: 06/05/2013  PCP: Default, Provider, MD  Admit date: 06/05/2013 Discharge date: 06/08/2013  Time spent: 65 minutes  Recommendations for Outpatient Follow-up:  1. Followup with M.D. at [facility].  Discharge Diagnoses:  Principal Problem:   End stage renal disease Active Problems:   CAD (coronary artery disease)   Chronic kidney disease (CKD), stage IV (severe)   Hypothyroidism   Hyperlipidemia   Confusion   Metabolic encephalopathy   Acute encephalopathy   Hospice care patient   Discharge Condition: Stable  Diet recommendation: Regular  Filed Weights   06/05/13 2251  Weight: 59.829 kg (131 lb 14.4 oz)    History of present illness:  [name] is a 80 y.o. male, with past medical history significant for CVA coronary artery disease hyperlipidemia history of TIA an incision disease on hemodialysis who was brought here from the dialysis unit after his right arm shunt was not working good and he could not have dialysis.  Patient also was confused according to the family and get worse later on and was brought here for evaluation. Nephrology was consulted. And there was a consultation for palliative care since the patient made comments about not restarting dialysis.    Hospital Course:  #1 end-stage renal disease on hemodialysis  Patient was brought to the ED from the dialysis unit after mild function of right upper extremity shunt and a such patient was unable to have dialysis. Patient has been seen by palliative care and goal is comfort measures. HD has been discontinued. Patient will be transferred to hospice home. #2 acute encephalopathy  Likely secondary to uremia secondary to problem #1. Patient recently hospitalized early on this month for metabolic encephalopathy which was felt to be secondary to uremia which improved after dialysis. At that time of admission CT of the head was done which was  negative. UA negative. Chest x-ray negative. Patient with poor prognosis and palliative care consultation was obtained and focus on comfort care. HD cancelled. [facility]. #3 hypothyroidism  Patient was maintained on his home regimen of Synthroid.  #4 anemia of chronic kidney disease  Stable.  #5 depression  On Zoloft.  #6 chronic CHF/coronary artery disease status post CABG  Hemodynamically stable. Continued on aspirin and beta blocker.  #7 hypertension  Continued labetalol and Norvasc. #8 prophylaxis  Heparin for DVT prophylaxis.  #9 dementia Worsening during the hospitalization. Patient was seen by palliative care and focusing on comfort measures. #10 prognosis  Patient has been seen by palliative care for goals of care. Patient with dementia presented with acute encephalopathy which could be likely secondary to worsening dementia versus uremia. Patient has been seen by palliative care and goals of care is full comfort, DO NOT RESUSCITATE, hospice home versus home with hospice. Palliative care is managing patient's symptoms and has been recommended to discontinue hemodialysis. Patient has an allergy to morphine and a such was placed on IV fentanyl during this hospitalization for pain management. Patient's symptom management will be assumed by [facility] attending/team on discharge. Patient will be discharged to hospice home today.     Procedures:  Chest x-ray 06/06/2013  Consultations: Nephrology: Dr Arta SilenceShertz 06/06/13  Palliative care: Dr Phillips OdorGolding 06/06/13   Discharge Exam: Filed Vitals:   06/08/13 0633  BP: 145/70  Pulse: 70  Temp: 98.4 F (36.9 C)  Resp: 18    General: nad Cardiovascular: RRR with 3/6 SEM Respiratory: CTAB anterior  lung fields  Discharge Instructions      Discharge Orders   Future Orders Complete By Expires   Diet general  As directed    Discharge instructions  As directed    Comments:     Follow up  with MD at SNF.       Medication List    STOP taking these medications       calcium acetate 667 MG capsule  Commonly known as:  PHOSLO     multivitamin with minerals Tabs tablet      TAKE these medications       acetaminophen 500 MG tablet  Commonly known as:  TYLENOL  Take 500 mg by mouth daily as needed for pain.     amLODipine 5 MG tablet  Commonly known as:  NORVASC  Take 1 tablet (5 mg total) by mouth daily.     aspirin EC 81 MG tablet  Take 81 mg by mouth daily.     camphor-menthol lotion  Commonly known as:  SARNA  Apply topically as needed for itching.     DSS 100 MG Caps  Take 100 mg by mouth 2 (two) times daily.     labetalol 200 MG tablet  Commonly known as:  NORMODYNE  Take 1 tablet (200 mg total) by mouth 2 (two) times daily.     levothyroxine 112 MCG tablet  Commonly known as:  SYNTHROID, LEVOTHROID  Take 112 mcg by mouth daily.     LORazepam 0.5 MG tablet  Commonly known as:  ATIVAN  Take 1 tablet (0.5 mg total) by mouth every 8 (eight) hours.     mirtazapine 7.5 MG tablet  Commonly known as:  REMERON  Take 7.5 mg by mouth at bedtime.     ondansetron 4 MG tablet  Commonly known as:  ZOFRAN  Take 1 tablet (4 mg total) by mouth every 6 (six) hours as needed for nausea.     senna-docusate 8.6-50 MG per tablet  Commonly known as:  Senokot-S  Take 1 tablet by mouth at bedtime.     sertraline 100 MG tablet  Commonly known as:  ZOLOFT  Take 50 mg by mouth daily.       Allergies  Allergen Reactions  . Codeine Other (See Comments)    Unknown reaction  . Morphine And Related     Delirium (per son, Nida Boatman)  . Nitroglycerin     Delrium? & headache, (per son, Nida Boatman)   Follow-up Information   Follow up with Olene Craven, MD. (f/u with MD at [facility])    Specialty:  Internal Medicine   Contact information:   [address] [facility] [place] [address] [phone]        The results of significant  diagnostics from this hospitalization (including imaging, microbiology, ancillary and laboratory) are listed below for reference.    Significant Diagnostic Studies: Dg Chest 2 View  05/11/2013   CLINICAL DATA:  Congestion  EXAM: CHEST  2 VIEW  COMPARISON:  03/21/2013  FINDINGS: There is bilateral interstitial thickening with patchy areas of alveolar airspace opacities. There are bilateral small pleural effusions. Stable cardiomediastinal silhouette. There is evidence of prior CABG. There is prominence of the central pulmonary vasculature.  The osseous structures are unremarkable.  IMPRESSION: Overall findings are most concerning for pulmonary edema.   Electronically Signed   By: Elige Ko   On: 05/11/2013 20:40   Ct Head Wo Contrast  05/13/2013   CLINICAL  DATA:  Headache with nausea  EXAM: CT HEAD WITHOUT CONTRAST  TECHNIQUE: Contiguous axial images were obtained from the base of the skull through the vertex without intravenous contrast.  COMPARISON:  Prior CT from 05/11/2013  FINDINGS: Extensive age-related cerebral atrophy with chronic microvascular ischemic disease again seen common stable as compared to prior study. Encephalomalacia within the left parietal lobe again noted, consistent with remote infarct. No new large vessel territory infarct identified. There is no intracranial hemorrhage. No mass lesion or midline shift. No extra-axial fluid collection. Ventricles are normal in size without evidence of hydrocephalus.  Prominent vascular calcifications noted within the distal vertebral arteries as well as the carotid siphons.  Calvarium is intact. Orbits are normal. Few scattered opacities noted within the frontal sinuses bilaterally, unchanged. Paranasal sinuses are otherwise clear. No mastoid effusion.  IMPRESSION: 1. No acute intracranial process. 2. Stable atrophy with chronic microvascular ischemic disease with remote left parietal infarct.   Electronically Signed   By: Rise Mu M.D.    On: 05/13/2013 07:02   Ct Head Wo Contrast  05/11/2013   CLINICAL DATA:  Fatigue.  EXAM: CT HEAD WITHOUT CONTRAST  TECHNIQUE: Contiguous axial images were obtained from the base of the skull through the vertex without intravenous contrast.  COMPARISON:  None.  FINDINGS: Periventricular white matter and corona radiata hypodensities favor chronic ischemic microvascular white matter disease. Hypodensity in the left parietal lobe is somewhat confluent and favors a remote watershed infarct. I doubt that this is late subacute with regard to chronicity.  No intracranial hemorrhage or characteristic findings of acute infarct. No mass lesion observed. There is dense atherosclerotic calcification in the carotid siphons.  IMPRESSION: 1. Chronic microvascular disease, with suspected old left watershed infarct. No acute intracranial findings observed.   Electronically Signed   By: Herbie Baltimore M.D.   On: 05/11/2013 20:40   Dg Chest Port 1 View  06/06/2013   CLINICAL DATA:  Altered mental status.  Cough.  EXAM: PORTABLE CHEST - 1 VIEW  COMPARISON:  05/13/2013  FINDINGS: Pulmonary opacities noted previously have resolved. Lungs are clear. Changes from CABG surgery are stable. Cardiac silhouette is normal in size. Mediastinum and hilar contours are unremarkable.  IMPRESSION: No acute cardiopulmonary disease.   Electronically Signed   By: Amie Portland M.D.   On: 06/06/2013 13:41   Dg Chest Port 1 View  05/13/2013   CLINICAL DATA:  Shortness of breath.  Follow-up edema.  EXAM: PORTABLE CHEST - 1 VIEW  COMPARISON:  05/11/2013 and 03/21/2013.  FINDINGS: 0733 hr. The heart size and mediastinal contours are stable status post CABG. There is increased diffuse interstitial prominence with patchy left greater than right basilar airspace opacities. In addition, there is peripheral density in the upper right hemithorax which is new from 2 months ago. This may reflect partially loculated pleural fluid. No pneumothorax is  identified.  IMPRESSION: Increased interstitial and basilar pulmonary opacities suspicious for worsening edema. There is possible partially loculated pleural fluid on the right.   Electronically Signed   By: Roxy Horseman M.D.   On: 05/13/2013 10:28    Microbiology: No results found for this or any previous visit (from the past 240 hour(s)).   Labs: Basic Metabolic Panel:  Recent Labs Lab 06/05/13 1645 06/06/13 0650  NA 145 146  K 4.4 4.5  CL 97 101  CO2 32 29  GLUCOSE 126* 89  BUN 63* 70*  CREATININE 8.06* 8.56*  CALCIUM 9.3 8.6   Liver Function Tests:  Recent Labs Lab 06/05/13 1645  AST 26  ALT 18  ALKPHOS 112  BILITOT 0.4  PROT 8.2  ALBUMIN 3.0*   No results found for this basename: LIPASE, AMYLASE,  in the last 168 hours No results found for this basename: AMMONIA,  in the last 168 hours CBC:  Recent Labs Lab 06/05/13 1645 06/06/13 0650  WBC 13.2* 11.3*  HGB 12.3* 11.1*  HCT 36.2* 32.8*  MCV 96.5 95.3  PLT 251 208   Cardiac Enzymes: No results found for this basename: CKTOTAL, CKMB, CKMBINDEX, TROPONINI,  in the last 168 hours BNP: BNP (last 3 results) No results found for this basename: PROBNP,  in the last 8760 hours CBG:  Recent Labs Lab 06/05/13 1736  GLUCAP 100*       Signed:  Tambra Muller  MD [facility] 06/08/2013, 9:15 AM

## 2013-06-08 NOTE — Plan of Care (Signed)
Problem: Discharge Progression Outcomes Goal: Activity appropriate for discharge plan Outcome: Completed/Met Date Met:  06/08/13 Pt is being D/Cd to [facility].

## 2013-07-08 DEATH — deceased

## 2013-10-31 ENCOUNTER — Encounter: Payer: Self-pay | Admitting: Cardiology

## 2014-03-04 ENCOUNTER — Telehealth: Payer: Self-pay

## 2014-03-04 NOTE — Telephone Encounter (Signed)
Patient died per Obituary °
